# Patient Record
Sex: Male | Born: 1960 | Race: White | Hispanic: No | Marital: Married | Smoking: Former smoker
Health system: Southern US, Community
[De-identification: ages and names within clinical notes are randomized; demographics above are authoritative.]

## PROBLEM LIST (undated history)

## (undated) DIAGNOSIS — I10 Essential (primary) hypertension: Secondary | ICD-10-CM

## (undated) DIAGNOSIS — M1990 Unspecified osteoarthritis, unspecified site: Secondary | ICD-10-CM

## (undated) DIAGNOSIS — K219 Gastro-esophageal reflux disease without esophagitis: Secondary | ICD-10-CM

## (undated) DIAGNOSIS — G709 Myoneural disorder, unspecified: Secondary | ICD-10-CM

## (undated) DIAGNOSIS — C61 Malignant neoplasm of prostate: Secondary | ICD-10-CM

## (undated) HISTORY — DX: Myoneural disorder, unspecified: G70.9

## (undated) HISTORY — PX: PROSTATE BIOPSY: SHX241

## (undated) HISTORY — PX: HERNIA REPAIR: SHX51

## (undated) HISTORY — DX: Malignant neoplasm of prostate: C61

## (undated) HISTORY — DX: Unspecified osteoarthritis, unspecified site: M19.90

## (undated) HISTORY — DX: Gastro-esophageal reflux disease without esophagitis: K21.9

## (undated) HISTORY — PX: CHOLECYSTECTOMY: SHX55

## (undated) HISTORY — DX: Essential (primary) hypertension: I10

## (undated) HISTORY — PX: ANAL FISSURE REPAIR: SHX2312

---

## 2016-01-10 ENCOUNTER — Other Ambulatory Visit: Payer: Self-pay | Admitting: Family Medicine

## 2016-01-10 DIAGNOSIS — R011 Cardiac murmur, unspecified: Secondary | ICD-10-CM

## 2016-01-13 ENCOUNTER — Ambulatory Visit: Payer: Self-pay

## 2016-07-12 ENCOUNTER — Encounter: Payer: Self-pay | Admitting: Family Medicine

## 2016-07-12 ENCOUNTER — Ambulatory Visit (INDEPENDENT_AMBULATORY_CARE_PROVIDER_SITE_OTHER): Payer: BLUE CROSS/BLUE SHIELD | Admitting: Family Medicine

## 2016-07-12 VITALS — BP 166/78 | HR 67 | Temp 98.3°F | Ht 67.5 in | Wt 200.2 lb

## 2016-07-12 DIAGNOSIS — I10 Essential (primary) hypertension: Secondary | ICD-10-CM

## 2016-07-12 DIAGNOSIS — Z125 Encounter for screening for malignant neoplasm of prostate: Secondary | ICD-10-CM | POA: Diagnosis not present

## 2016-07-12 DIAGNOSIS — R202 Paresthesia of skin: Secondary | ICD-10-CM

## 2016-07-12 DIAGNOSIS — Z13220 Encounter for screening for lipoid disorders: Secondary | ICD-10-CM

## 2016-07-12 LAB — LIPID PANEL PICCOLO, WAIVED
CHOL/HDL RATIO PICCOLO,WAIVE: 3.1 mg/dL
Cholesterol Piccolo, Waived: 194 mg/dL (ref <200)
HDL Chol Piccolo, Waived: 62 mg/dL (ref >59)
LDL CHOL CALC PICCOLO WAIVED: 112 mg/dL — AB (ref <100)
Triglycerides Piccolo,Waived: 101 mg/dL (ref <150)
VLDL CHOL CALC PICCOLO,WAIVE: 20 mg/dL (ref <30)

## 2016-07-12 LAB — UA/M W/RFLX CULTURE, ROUTINE
BILIRUBIN UA: NEGATIVE
GLUCOSE, UA: NEGATIVE
KETONES UA: NEGATIVE
LEUKOCYTES UA: NEGATIVE
Nitrite, UA: NEGATIVE
PROTEIN UA: NEGATIVE
RBC UA: NEGATIVE
SPEC GRAV UA: 1.015 (ref 1.005–1.030)
Urobilinogen, Ur: 0.2 mg/dL (ref 0.2–1.0)
pH, UA: 7 (ref 5.0–7.5)

## 2016-07-12 LAB — MICROALBUMIN, URINE WAIVED
Creatinine, Urine Waived: 100 mg/dL (ref 10–300)
MICROALB, UR WAIVED: 10 mg/L (ref 0–19)
Microalb/Creat Ratio: <30 mg/g (ref <30)

## 2016-07-12 LAB — BAYER DCA HB A1C WAIVED: HB A1C: 5.4 % (ref <7.0)

## 2016-07-12 NOTE — Patient Instructions (Addendum)
DASH Eating Plan DASH stands for "Dietary Approaches to Stop Hypertension." The DASH eating plan is a healthy eating plan that has been shown to reduce high blood pressure (hypertension). Additional health benefits may include reducing the risk of type 2 diabetes mellitus, heart disease, and stroke. The DASH eating plan may also help with weight loss. What do I need to know about the DASH eating plan? For the DASH eating plan, you will follow these general guidelines:  Choose foods with less than 150 milligrams of sodium per serving (as listed on the food label).  Use salt-free seasonings or herbs instead of table salt or sea salt.  Check with your health care provider or pharmacist before using salt substitutes.  Eat lower-sodium products. These are often labeled as "low-sodium" or "no salt added."  Eat fresh foods. Avoid eating a lot of canned foods.  Eat more vegetables, fruits, and low-fat dairy products.  Choose whole grains. Look for the word "whole" as the first word in the ingredient list.  Choose fish and skinless chicken or turkey more often than red meat. Limit fish, poultry, and meat to 6 oz (170 g) each day.  Limit sweets, desserts, sugars, and sugary drinks.  Choose heart-healthy fats.  Eat more home-cooked food and less restaurant, buffet, and fast food.  Limit fried foods.  Do not fry foods. Cook foods using methods such as baking, boiling, grilling, and broiling instead.  When eating at a restaurant, ask that your food be prepared with less salt, or no salt if possible. What foods can I eat? Seek help from a dietitian for individual calorie needs. Grains  Whole grain or whole wheat bread. Brown rice. Whole grain or whole wheat pasta. Quinoa, bulgur, and whole grain cereals. Low-sodium cereals. Corn or whole wheat flour tortillas. Whole grain cornbread. Whole grain crackers. Low-sodium crackers. Vegetables  Fresh or frozen vegetables (raw, steamed, roasted, or  grilled). Low-sodium or reduced-sodium tomato and vegetable juices. Low-sodium or reduced-sodium tomato sauce and paste. Low-sodium or reduced-sodium canned vegetables. Fruits  All fresh, canned (in natural juice), or frozen fruits. Meat and Other Protein Products  Ground beef (85% or leaner), grass-fed beef, or beef trimmed of fat. Skinless chicken or turkey. Ground chicken or turkey. Pork trimmed of fat. All fish and seafood. Eggs. Dried beans, peas, or lentils. Unsalted nuts and seeds. Unsalted canned beans. Dairy  Low-fat dairy products, such as skim or 1% milk, 2% or reduced-fat cheeses, low-fat ricotta or cottage cheese, or plain low-fat yogurt. Low-sodium or reduced-sodium cheeses. Fats and Oils  Tub margarines without trans fats. Light or reduced-fat mayonnaise and salad dressings (reduced sodium). Avocado. Safflower, olive, or canola oils. Natural peanut or almond butter. Other  Unsalted popcorn and pretzels. The items listed above may not be a complete list of recommended foods or beverages. Contact your dietitian for more options.  What foods are not recommended? Grains  White bread. White pasta. White rice. Refined cornbread. Bagels and croissants. Crackers that contain trans fat. Vegetables  Creamed or fried vegetables. Vegetables in a cheese sauce. Regular canned vegetables. Regular canned tomato sauce and paste. Regular tomato and vegetable juices. Fruits  Canned fruit in light or heavy syrup. Fruit juice. Meat and Other Protein Products  Fatty cuts of meat. Ribs, chicken wings, bacon, sausage, bologna, salami, chitterlings, fatback, hot dogs, bratwurst, and packaged luncheon meats. Salted nuts and seeds. Canned beans with salt. Dairy  Whole or 2% milk, cream, half-and-half, and cream cheese. Whole-fat or sweetened yogurt. Full-fat cheeses   or blue cheese. Nondairy creamers and whipped toppings. Processed cheese, cheese spreads, or cheese curds. Condiments  Onion and garlic  salt, seasoned salt, table salt, and sea salt. Canned and packaged gravies. Worcestershire sauce. Tartar sauce. Barbecue sauce. Teriyaki sauce. Soy sauce, including reduced sodium. Steak sauce. Fish sauce. Oyster sauce. Cocktail sauce. Horseradish. Ketchup and mustard. Meat flavorings and tenderizers. Bouillon cubes. Hot sauce. Tabasco sauce. Marinades. Taco seasonings. Relishes. Fats and Oils  Butter, stick margarine, lard, shortening, ghee, and bacon fat. Coconut, palm kernel, or palm oils. Regular salad dressings. Other  Pickles and olives. Salted popcorn and pretzels. The items listed above may not be a complete list of foods and beverages to avoid. Contact your dietitian for more information.  Where can I find more information? National Heart, Lung, and Blood Institute: www.nhlbi.nih.gov/health/health-topics/topics/dash/ This information is not intended to replace advice given to you by your health care provider. Make sure you discuss any questions you have with your health care provider. Document Released: 05/11/2011 Document Revised: 10/28/2015 Document Reviewed: 03/26/2013 Elsevier Interactive Patient Education  2017 Elsevier Inc.  

## 2016-07-12 NOTE — Progress Notes (Signed)
BP (!) 166/78 (BP Location: Left Arm, Patient Position: Sitting, Cuff Size: Normal)   Pulse 67   Temp 98.3 F (36.8 C)   Ht 5' 7.5" (1.715 m)   Wt 200 lb 3.2 oz (90.8 kg)   SpO2 95%   BMI 30.89 kg/m    Subjective:    Patient ID: Douglas Murillo, male    DOB: 1961/03/13, 56 y.o.   MRN: [id number]  HPI: Douglas Murillo is a 56 y.o. male here today to establish care  Chief Complaint  Patient presents with  . Establish Care   He notes that he has not seen a doctor in about 20 years.   NUMBNESS Duration: months Onset: fluctuates, started in his 66s and then stopped and came back about a month ago Location: bottom feet Bilateral: no Symmetric: no Decreased sensation: yes  Weakness: no Pain: yes Quality:  burning Severity: moderate  Frequency: Worse at night Trauma: no Recent illness: no Diabetes: no Thyroid disease: no  HIV: no  Alcoholism: no  Spinal cord injury: no Alleviating factors: nothing Aggravating factors: nothing Status: stable Treatments attempted: none  HYPERTENSION- was on medicine in his 74s and then stopped it. Cut the salt out and hasn't needed it in the past Hypertension status: exacerbated  Satisfied with current treatment? no Duration of hypertension: chronic BP monitoring frequency:  not checking BP range: unknown BP medication side effects:  yes Medication compliance: poor compliance Previous BP meds: unknown Aspirin: no Recurrent headaches: no Visual changes: yes Palpitations: no Dyspnea: no Chest pain: no Lower extremity edema: no Dizzy/lightheaded: no   Active Ambulatory Problems    Diagnosis Date Noted  . HTN (hypertension) 07/12/2016   Resolved Ambulatory Problems    Diagnosis Date Noted  . No Resolved Ambulatory Problems   Past Medical History:  Diagnosis Date  . GERD (gastroesophageal reflux disease)   . Hypertension    Past Surgical History:  Procedure Laterality Date  . ANAL FISSURE REPAIR    .  CHOLECYSTECTOMY     Outpatient Encounter Prescriptions as of 07/12/2016  Medication Sig  . esomeprazole (NEXIUM) 20 MG capsule Take 20 mg by mouth daily at 12 noon.  Marland Kitchen oxymetazoline (AFRIN) 0.05 % nasal spray Place 1 spray into both nostrils 2 (two) times daily.   No facility-administered encounter medications on file as of 07/12/2016.    Allergies  Allergen Reactions  . Prednisone Other (See Comments)    Joint Pain   Family History  Problem Relation Age of Onset  . Arthritis Mother   . Cancer Mother     breast  . Cancer Father     lymphoma  . Diabetes Father   . Hearing loss Father   . Heart disease Father   . Hypertension Father   . Stroke Maternal Grandmother   . Arthritis Maternal Grandmother   . Birth defects Maternal Grandfather   . Arthritis Paternal Grandmother   . Heart disease Paternal Grandfather   . Graves' disease Daughter    Social History   Social History  . Marital status: Married    Spouse name: N/A  . Number of children: N/A  . Years of education: N/A   Social History Main Topics  . Smoking status: Former Smoker    Types: Cigarettes    Quit date: 12/20/2015  . Smokeless tobacco: Never Used  . Alcohol use No  . Drug use: Yes    Types: Marijuana  . Sexual activity: Not Asked   Other Topics Concern  .  None   Social History Narrative  . None    Review of Systems  Constitutional: Negative.   HENT: Positive for congestion, postnasal drip and rhinorrhea. Negative for dental problem, drooling, ear discharge, ear pain, facial swelling, hearing loss, mouth sores, nosebleeds, sinus pain, sinus pressure, sneezing, sore throat, tinnitus, trouble swallowing and voice change.   Respiratory: Negative.   Cardiovascular: Negative.   Gastrointestinal: Negative.   Musculoskeletal: Negative.   Neurological: Positive for numbness. Negative for dizziness, tremors, seizures, syncope, facial asymmetry, speech difficulty, weakness, light-headedness and headaches.    Psychiatric/Behavioral: Negative.     Per HPI unless specifically indicated above     Objective:    BP (!) 166/78 (BP Location: Left Arm, Patient Position: Sitting, Cuff Size: Normal)   Pulse 67   Temp 98.3 F (36.8 C)   Ht 5' 7.5" (1.715 m)   Wt 200 lb 3.2 oz (90.8 kg)   SpO2 95%   BMI 30.89 kg/m   Wt Readings from Last 3 Encounters:  07/12/16 200 lb 3.2 oz (90.8 kg)    Physical Exam  Constitutional: He is oriented to person, place, and time. He appears well-developed and well-nourished. No distress.  HENT:  Head: Normocephalic and atraumatic.  Right Ear: Hearing normal.  Left Ear: Hearing normal.  Nose: Nose normal.  Eyes: Conjunctivae and lids are normal. Right eye exhibits no discharge. Left eye exhibits no discharge. No scleral icterus.  Cardiovascular: Normal rate, regular rhythm, normal heart sounds and intact distal pulses.  Exam reveals no gallop and no friction rub.   No murmur heard. Pulmonary/Chest: Effort normal and breath sounds normal. No respiratory distress. He has no wheezes. He has no rales. He exhibits no tenderness.  Musculoskeletal: Normal range of motion.  Neurological: He is alert and oriented to person, place, and time.  Skin: Skin is warm, dry and intact. No rash noted. He is not diaphoretic. No erythema. No pallor.  Psychiatric: He has a normal mood and affect. His speech is normal and behavior is normal. Judgment and thought content normal. Cognition and memory are normal.  Nursing note and vitals reviewed.   No results found for this or any previous visit.    Assessment & Plan:   Problem List Items Addressed This Visit      Cardiovascular and Mediastinum   HTN (hypertension) - Primary    Will work on [employer]. Recheck 1 month with physical.      Relevant Orders   UA/M w/rflx Culture, Routine   Comprehensive metabolic panel   Microalbumin, Urine Waived    Other Visit Diagnoses    Paresthesias       Concern for diabetes with family  history. Will check labs. Await results. A1c 5.4. Will await other labs.   Relevant Orders   Bayer DCA Hb A1c Waived   CBC with Differential/Platelet   TSH   Comprehensive metabolic panel   Screening for cholesterol level       Checking levels today. Await results. Has had coffee with 2 teaspoons of sugar today.   Relevant Orders   Lipid Panel Piccolo, Waived   Screening for prostate cancer       Labs drawn today. Await results.    Relevant Orders   PSA       Follow up plan: Return in about 4 weeks (around 08/09/2016) for Physical/Recheck BP.

## 2016-07-12 NOTE — Assessment & Plan Note (Signed)
Will work on [employer]. Recheck 1 month with physical.

## 2016-07-13 ENCOUNTER — Encounter: Payer: Self-pay | Admitting: Family Medicine

## 2016-07-13 LAB — COMPREHENSIVE METABOLIC PANEL
ALK PHOS: 102 IU/L (ref 39–117)
ALT: 22 IU/L (ref 0–44)
AST: 30 IU/L (ref 0–40)
Albumin/Globulin Ratio: 1.5 (ref 1.2–2.2)
Albumin: 4.3 g/dL (ref 3.5–5.5)
BUN / CREAT RATIO: 9 (ref 9–20)
BUN: 11 mg/dL (ref 6–24)
Bilirubin Total: 1.2 mg/dL (ref 0.0–1.2)
CALCIUM: 10.2 mg/dL (ref 8.7–10.2)
CHLORIDE: 98 mmol/L (ref 96–106)
CO2: 25 mmol/L (ref 18–29)
Creatinine, Ser: 1.28 mg/dL — ABNORMAL HIGH (ref 0.76–1.27)
GFR calc non Af Amer: 63 mL/min/{1.73_m2} (ref >59)
GFR, EST AFRICAN AMERICAN: 73 mL/min/{1.73_m2} (ref >59)
GLOBULIN, TOTAL: 2.9 g/dL (ref 1.5–4.5)
GLUCOSE: 91 mg/dL (ref 65–99)
POTASSIUM: 4.3 mmol/L (ref 3.5–5.2)
SODIUM: 138 mmol/L (ref 134–144)
Total Protein: 7.2 g/dL (ref 6.0–8.5)

## 2016-07-13 LAB — CBC WITH DIFFERENTIAL/PLATELET
BASOS ABS: 0 10*3/uL (ref 0.0–0.2)
Basos: 1 %
EOS (ABSOLUTE): 0.3 10*3/uL (ref 0.0–0.4)
Eos: 3 %
HEMOGLOBIN: 15.5 g/dL (ref 13.0–17.7)
Hematocrit: 44.3 % (ref 37.5–51.0)
IMMATURE GRANS (ABS): 0 10*3/uL (ref 0.0–0.1)
IMMATURE GRANULOCYTES: 1 %
Lymphocytes Absolute: 2.4 10*3/uL (ref 0.7–3.1)
Lymphs: 28 %
MCH: 32 pg (ref 26.6–33.0)
MCHC: 35 g/dL (ref 31.5–35.7)
MCV: 92 fL (ref 79–97)
MONOCYTES: 10 %
Monocytes Absolute: 0.8 10*3/uL (ref 0.1–0.9)
NEUTROS ABS: 5.2 10*3/uL (ref 1.4–7.0)
NEUTROS PCT: 57 %
PLATELETS: 225 10*3/uL (ref 150–379)
RBC: 4.84 x10E6/uL (ref 4.14–5.80)
RDW: 13.7 % (ref 12.3–15.4)
WBC: 8.8 10*3/uL (ref 3.4–10.8)

## 2016-07-13 LAB — TSH: TSH: 1.29 u[IU]/mL (ref 0.450–4.500)

## 2016-07-13 LAB — PSA: PROSTATE SPECIFIC AG, SERUM: 2.5 ng/mL (ref 0.0–4.0)

## 2016-08-01 ENCOUNTER — Ambulatory Visit (INDEPENDENT_AMBULATORY_CARE_PROVIDER_SITE_OTHER): Payer: BLUE CROSS/BLUE SHIELD | Admitting: Family Medicine

## 2016-08-01 ENCOUNTER — Encounter: Payer: Self-pay | Admitting: Family Medicine

## 2016-08-01 VITALS — BP 184/90 | HR 62 | Temp 98.4°F | Resp 17 | Ht 67.5 in | Wt 198.0 lb

## 2016-08-01 DIAGNOSIS — D485 Neoplasm of uncertain behavior of skin: Secondary | ICD-10-CM

## 2016-08-01 DIAGNOSIS — I10 Essential (primary) hypertension: Secondary | ICD-10-CM

## 2016-08-01 DIAGNOSIS — Z0000 Encounter for general adult medical examination without abnormal findings: Secondary | ICD-10-CM | POA: Diagnosis not present

## 2016-08-01 MED ORDER — LISINOPRIL 10 MG PO TABS: 10.0000 mg | ORAL_TABLET | Freq: Every day | ORAL | 3 refills | Status: DC

## 2016-08-01 NOTE — Progress Notes (Signed)
BP (!) 184/90   Pulse 62   Temp 98.4 F (36.9 C) (Oral)   Resp 17   Ht 5' 7.5" (1.715 m)   Wt 198 lb (89.8 kg)   SpO2 96%   BMI 30.55 kg/m    Subjective:    Patient ID: Douglas Murillo, male    DOB: [birth year], 56 y.o.   MRN: [id number]  HPI: Douglas Murillo is a 56 y.o. male presenting on 08/01/2016 for comprehensive medical examination. Current medical complaints include:  HYPERTENSION Hypertension status: uncontrolled  Satisfied with current treatment? no Duration of hypertension: unknown BP monitoring frequency:  not checking Aspirin: no Recurrent headaches: no Visual changes: no Palpitations: no Dyspnea: no Chest pain: no Lower extremity edema: no Dizzy/lightheaded: no  He currently lives with: wife Interim Problems from his last visit: no  Depression Screen done today and results listed below:  Depression screen [facility] 2/9 08/01/2016  Decreased Interest 0  Down, Depressed, Hopeless 0  PHQ - 2 Score 0    Past Medical History:  Past Medical History:  Diagnosis Date  . GERD (gastroesophageal reflux disease)   . Hypertension     Surgical History:  Past Surgical History:  Procedure Laterality Date  . ANAL FISSURE REPAIR    . CHOLECYSTECTOMY      Medications:  Current Outpatient Prescriptions on File Prior to Visit  Medication Sig  . esomeprazole (NEXIUM) 20 MG capsule Take 20 mg by mouth daily at 12 noon.  Marland Kitchen oxymetazoline (AFRIN) 0.05 % nasal spray Place 1 spray into both nostrils 2 (two) times daily.   No current facility-administered medications on file prior to visit.     Allergies:  Allergies  Allergen Reactions  . Prednisone Other (See Comments)    Joint Pain    Social History:  Social History   Social History  . Marital status: Married    Spouse name: N/A  . Number of children: N/A  . Years of education: N/A   Occupational History  . Not on file.   Social History Main Topics  . Smoking status: Former Smoker    Types:  Cigarettes    Quit date: 12/20/2015  . Smokeless tobacco: Never Used  . Alcohol use No  . Drug use: Yes    Types: Marijuana  . Sexual activity: Not on file   Other Topics Concern  . Not on file   Social History Narrative  . No narrative on file   History  Smoking Status  . Former Smoker  . Types: Cigarettes  . Quit date: 12/20/2015  Smokeless Tobacco  . Never Used   History  Alcohol Use No    Family History:  Family History  Problem Relation Age of Onset  . Arthritis Mother   . Cancer Mother     breast  . Cancer Father     lymphoma  . Diabetes Father   . Hearing loss Father   . Heart disease Father   . Hypertension Father   . Stroke Maternal Grandmother   . Arthritis Maternal Grandmother   . Birth defects Maternal Grandfather   . Arthritis Paternal Grandmother   . Heart disease Paternal Grandfather   . Graves' disease Daughter     Past medical history, surgical history, medications, allergies, family history and social history reviewed with patient today and changes made to appropriate areas of the chart.   Review of Systems  Constitutional: Negative.   HENT: Positive for hearing loss. Negative for congestion, ear discharge, ear  pain, nosebleeds, sinus pain, sore throat and tinnitus.   Eyes: Negative.   Respiratory: Negative.  Negative for stridor.   Cardiovascular: Negative.   Gastrointestinal: Negative.   Genitourinary: Negative.        Nocturia x4-5  + Weak stream No incomplete emptying or pushing to get started + dribbling  Musculoskeletal: Negative.   Skin: Negative.   Neurological: Positive for tingling. Negative for dizziness, tremors, sensory change, speech change, focal weakness, seizures, loss of consciousness and headaches.  Endo/Heme/Allergies: Positive for environmental allergies. Negative for polydipsia. Does not bruise/bleed easily.  Psychiatric/Behavioral: Negative for depression, hallucinations, memory loss, substance abuse and suicidal  ideas. The patient is nervous/anxious. The patient does not have insomnia.     All other ROS negative except what is listed above and in the HPI.      Objective:    BP (!) 184/90   Pulse 62   Temp 98.4 F (36.9 C) (Oral)   Resp 17   Ht 5' 7.5" (1.715 m)   Wt 198 lb (89.8 kg)   SpO2 96%   BMI 30.55 kg/m   Wt Readings from Last 3 Encounters:  08/01/16 198 lb (89.8 kg)  07/12/16 200 lb 3.2 oz (90.8 kg)    Physical Exam  Constitutional: He is oriented to person, place, and time. He appears well-developed and well-nourished. No distress.  HENT:  Head: Normocephalic and atraumatic.  Right Ear: Hearing, tympanic membrane, external ear and ear canal normal.  Left Ear: Hearing, tympanic membrane, external ear and ear canal normal.  Nose: Nose normal.  Mouth/Throat: Uvula is midline, oropharynx is clear and moist and mucous membranes are normal. No oropharyngeal exudate.  Eyes: Conjunctivae, EOM and lids are normal. Pupils are equal, round, and reactive to light. Right eye exhibits no discharge. Left eye exhibits no discharge. No scleral icterus.  Neck: Normal range of motion. Neck supple. No JVD present. No tracheal deviation present. No thyromegaly present.  Cardiovascular: Normal rate, regular rhythm, normal heart sounds and intact distal pulses.  Exam reveals no gallop and no friction rub.   No murmur heard. Pulmonary/Chest: Effort normal and breath sounds normal. No stridor. No respiratory distress. He has no wheezes. He has no rales. He exhibits no tenderness.  Abdominal: Soft. Bowel sounds are normal. He exhibits no distension and no mass. There is no tenderness. There is no rebound and no guarding.  Genitourinary:  Genitourinary Comments: Deferred at patient's request  Musculoskeletal: Normal range of motion. He exhibits no edema, tenderness or deformity.  Lymphadenopathy:    He has no cervical adenopathy.  Neurological: He is alert and oriented to person, place, and time. He  has normal reflexes. He displays normal reflexes. No cranial nerve deficit. He exhibits normal muscle tone. Coordination normal.  Skin: Skin is warm, dry and intact. No rash noted. He is not diaphoretic. No erythema. No pallor.  0.5cm hyperpigmented lesion on top of his head, 1.5cm raised hyperpigmented lesion on L side of his neck  Psychiatric: His speech is normal and behavior is normal. Judgment and thought content normal. His mood appears anxious. Cognition and memory are normal.  Nursing note and vitals reviewed.   Results for orders placed or performed in visit on 07/12/16  Lipid Panel Piccolo, [employer]  Result Value Ref Range   Cholesterol Piccolo, Waived 194 <200 mg/dL   HDL Chol Piccolo, Waived 62 >59 mg/dL   Triglycerides Piccolo,Waived 101 <150 mg/dL   Chol/HDL Ratio Piccolo,Waive 3.1 mg/dL   LDL Chol [employer]  Waived 112 (H) <100 mg/dL   VLDL Chol Calc Piccolo,Waive 20 <30 mg/dL  Bayer DCA Hb A1c Waived  Result Value Ref Range   Bayer DCA Hb A1c Waived 5.4 <7.0 %  CBC with Differential/Platelet  Result Value Ref Range   WBC 8.8 3.4 - 10.8 x10E3/uL   RBC 4.84 4.14 - 5.80 x10E6/uL   Hemoglobin 15.5 13.0 - 17.7 g/dL   Hematocrit 44.3 37.5 - 51.0 %   MCV 92 79 - 97 fL   MCH 32.0 26.6 - 33.0 pg   MCHC 35.0 31.5 - 35.7 g/dL   RDW 13.7 12.3 - 15.4 %   Platelets 225 150 - 379 x10E3/uL   Neutrophils 57 Not Estab. %   Lymphs 28 Not Estab. %   Monocytes 10 Not Estab. %   Eos 3 Not Estab. %   Basos 1 Not Estab. %   Neutrophils Absolute 5.2 1.4 - 7.0 x10E3/uL   Lymphocytes Absolute 2.4 0.7 - 3.1 x10E3/uL   Monocytes Absolute 0.8 0.1 - 0.9 x10E3/uL   EOS (ABSOLUTE) 0.3 0.0 - 0.4 x10E3/uL   Basophils Absolute 0.0 0.0 - 0.2 x10E3/uL   Immature Granulocytes 1 Not Estab. %   Immature Grans (Abs) 0.0 0.0 - 0.1 x10E3/uL  TSH  Result Value Ref Range   TSH 1.290 0.450 - 4.500 uIU/mL  PSA  Result Value Ref Range   Prostate Specific Ag, Serum 2.5 0.0 - 4.0 ng/mL  UA/M w/rflx  Culture, Routine  Result Value Ref Range   Specific Gravity, UA 1.015 1.005 - 1.030   pH, UA 7.0 5.0 - 7.5   Color, UA Yellow Yellow   Appearance Ur Clear Clear   Leukocytes, UA Negative Negative   Protein, UA Negative Negative/Trace   Glucose, UA Negative Negative   Ketones, UA Negative Negative   RBC, UA Negative Negative   Bilirubin, UA Negative Negative   Urobilinogen, Ur 0.2 0.2 - 1.0 mg/dL   Nitrite, UA Negative Negative  Comprehensive metabolic panel  Result Value Ref Range   Glucose 91 65 - 99 mg/dL   BUN 11 6 - 24 mg/dL   Creatinine, Ser 1.28 (H) 0.76 - 1.27 mg/dL   GFR calc non Af Amer 63 >59 mL/min/1.73   GFR calc Af Amer 73 >59 mL/min/1.73   BUN/Creatinine Ratio 9 9 - 20   Sodium 138 134 - 144 mmol/L   Potassium 4.3 3.5 - 5.2 mmol/L   Chloride 98 96 - 106 mmol/L   CO2 25 18 - 29 mmol/L   Calcium 10.2 8.7 - 10.2 mg/dL   Total Protein 7.2 6.0 - 8.5 g/dL   Albumin 4.3 3.5 - 5.5 g/dL   Globulin, Total 2.9 1.5 - 4.5 g/dL   Albumin/Globulin Ratio 1.5 1.2 - 2.2   Bilirubin Total 1.2 0.0 - 1.2 mg/dL   Alkaline Phosphatase 102 39 - 117 IU/L   AST 30 0 - 40 IU/L   ALT 22 0 - 44 IU/L  Microalbumin, Urine Waived  Result Value Ref Range   Microalb, Ur Waived 10 0 - 19 mg/L   Creatinine, Urine Waived 100 10 - 300 mg/dL   Microalb/Creat Ratio <30 <30 mg/g      Assessment & Plan:   Problem List Items Addressed This Visit      Cardiovascular and Mediastinum   HTN (hypertension)    Will start 10mg  lisinopril and recheck 2-3 weeks. Will likely need higher dose, but I think there is a white-coat component here, and we don't  want to drop him too quickly      Relevant Medications   lisinopril (PRINIVIL,ZESTRIL) 10 MG tablet    Other Visit Diagnoses    Routine general medical examination at a health care facility    -  Primary   Vaccines discussed as below. Screening labs checked last visit. Colonoscopy done previously- will likely need another, but will discuss next  visit. Work on diet   Neoplasm of uncertain behavior of skin       Will remove by shave biopsy next visit and send for pathology.       Discussed aspirin prophylaxis for myocardial infarction prevention and decision was it was not indicated  LABORATORY TESTING:  Health maintenance labs ordered today as discussed above.   IMMUNIZATIONS:   - Tdap: Tetanus vaccination status reviewed: last tetanus booster within 10 years. - Influenza: Refused - Pneumovax: Refused - Prevnar: Refused - Zostavax vaccine: Refused  SCREENING: - Colonoscopy: Done previously about 56yo at [facility] with Dr. Jamal Collin- will get records  Discussed with patient purpose of the colonoscopy is to detect colon cancer at curable precancerous or early stages   PATIENT COUNSELING:    Sexuality: Discussed sexually transmitted diseases, partner selection, use of condoms, avoidance of unintended pregnancy  and contraceptive alternatives.   Advised to avoid cigarette smoking.  I discussed with the patient that most people either abstain from alcohol or drink within safe limits (<=14/week and <=4 drinks/occasion for males, <=7/weeks and <= 3 drinks/occasion for females) and that the risk for alcohol disorders and other health effects rises proportionally with the number of drinks per week and how often a drinker exceeds daily limits.  Discussed cessation/primary prevention of drug use and availability of treatment for abuse.   Diet: Encouraged to adjust caloric intake to maintain  or achieve ideal body weight, to reduce intake of dietary saturated fat and total fat, to limit sodium intake by avoiding high sodium foods and not adding table salt, and to maintain adequate dietary potassium and calcium preferably from fresh fruits, vegetables, and low-fat dairy products.    stressed the importance of regular exercise  Injury prevention: Discussed safety belts, safety helmets, smoke detector, smoking near bedding or upholstery.    Dental health: Discussed importance of regular tooth brushing, flossing, and dental visits.   Follow up plan: NEXT PREVENTATIVE PHYSICAL DUE IN 1 YEAR. Return 2-3 weeks, for Shave biopsy and BP check.

## 2016-08-01 NOTE — Patient Instructions (Addendum)
 Health Maintenance, Male A healthy lifestyle and preventive care is important for your health and wellness. Ask your health care provider about what schedule of regular examinations is right for you. What should I know about weight and diet?  Eat a Healthy Diet  Eat plenty of vegetables, fruits, whole grains, low-fat dairy products, and lean protein.  Do not eat a lot of foods high in solid fats, added sugars, or salt. Maintain a Healthy Weight  Regular exercise can help you achieve or maintain a healthy weight. You should:  Do at least 150 minutes of exercise each week. The exercise should increase your heart rate and make you sweat (moderate-intensity exercise).  Do strength-training exercises at least twice a week. Watch Your Levels of Cholesterol and Blood Lipids  Have your blood tested for lipids and cholesterol every 5 years starting at 55 years of age. If you are at high risk for heart disease, you should start having your blood tested when you are 55 years old. You may need to have your cholesterol levels checked more often if:  Your lipid or cholesterol levels are high.  You are older than 55 years of age.  You are at high risk for heart disease. What should I know about cancer screening? Many types of cancers can be detected early and may often be prevented. Lung Cancer  You should be screened every year for lung cancer if:  You are a current smoker who has smoked for at least 30 years.  You are a former smoker who has quit within the past 15 years.  Talk to your health care provider about your screening options, when you should start screening, and how often you should be screened. Colorectal Cancer  Routine colorectal cancer screening usually begins at 55 years of age and should be repeated every 5-10 years until you are 55 years old. You may need to be screened more often if early forms of precancerous polyps or small growths are found. Your health care provider  may recommend screening at an earlier age if you have risk factors for colon cancer.  Your health care provider may recommend using home test kits to check for hidden blood in the stool.  A small camera at the end of a tube can be used to examine your colon (sigmoidoscopy or colonoscopy). This checks for the earliest forms of colorectal cancer. Prostate and Testicular Cancer  Depending on your age and overall health, your health care provider may do certain tests to screen for prostate and testicular cancer.  Talk to your health care provider about any symptoms or concerns you have about testicular or prostate cancer. Skin Cancer  Check your skin from head to toe regularly.  Tell your health care provider about any new moles or changes in moles, especially if:  There is a change in a mole's size, shape, or color.  You have a mole that is larger than a pencil eraser.  Always use sunscreen. Apply sunscreen liberally and repeat throughout the day.  Protect yourself by wearing long sleeves, pants, a wide-brimmed hat, and sunglasses when outside. What should I know about heart disease, diabetes, and high blood pressure?  If you are 18-39 years of age, have your blood pressure checked every 3-5 years. If you are 40 years of age or older, have your blood pressure checked every year. You should have your blood pressure measured twice-once when you are at a hospital or clinic, and once when you are not at   a hospital or clinic. Record the average of the two measurements. To check your blood pressure when you are not at a hospital or clinic, you can use:  An automated blood pressure machine at a pharmacy.  A home blood pressure monitor.  Talk to your health care provider about your target blood pressure.  If you are between 45-79 years old, ask your health care provider if you should take aspirin to prevent heart disease.  Have regular diabetes screenings by checking your fasting blood sugar  level.  If you are at a normal weight and have a low risk for diabetes, have this test once every three years after the age of 45.  If you are overweight and have a high risk for diabetes, consider being tested at a younger age or more often.  A one-time screening for abdominal aortic aneurysm (AAA) by ultrasound is recommended for men aged 65-75 years who are current or former smokers. What should I know about preventing infection? Hepatitis B  If you have a higher risk for hepatitis B, you should be screened for this virus. Talk with your health care provider to find out if you are at risk for hepatitis B infection. Hepatitis C  Blood testing is recommended for:  Everyone born from 1945 through 1965.  Anyone with known risk factors for hepatitis C. Sexually Transmitted Diseases (STDs)  You should be screened each year for STDs including gonorrhea and chlamydia if:  You are sexually active and are younger than 55 years of age.  You are older than 55 years of age and your health care provider tells you that you are at risk for this type of infection.  Your sexual activity has changed since you were last screened and you are at an increased risk for chlamydia or gonorrhea. Ask your health care provider if you are at risk.  Talk with your health care provider about whether you are at high risk of being infected with HIV. Your health care provider may recommend a prescription medicine to help prevent HIV infection. What else can I do?  Schedule regular health, dental, and eye exams.  Stay current with your vaccines (immunizations).  Do not use any tobacco products, such as cigarettes, chewing tobacco, and e-cigarettes. If you need help quitting, ask your health care provider.  Limit alcohol intake to no more than 2 drinks per day. One drink equals 12 ounces of beer, 5 ounces of wine, or 1 ounces of hard liquor.  Do not use street drugs.  Do not share needles.  Ask your health  care provider for help if you need support or information about quitting drugs.  Tell your health care provider if you often feel depressed.  Tell your health care provider if you have ever been abused or do not feel safe at home. This information is not intended to replace advice given to you by your health care provider. Make sure you discuss any questions you have with your health care provider. Document Released: 11/18/2007 Document Revised: 01/19/2016 Document Reviewed: 02/23/2015 Elsevier Interactive Patient Education  2017 [employer]  

## 2016-08-01 NOTE — Assessment & Plan Note (Signed)
Will start 10mg  lisinopril and recheck 2-3 weeks. Will likely need higher dose, but I think there is a white-coat component here, and we don't want to drop him too quickly

## 2016-08-08 ENCOUNTER — Telehealth: Payer: Self-pay | Admitting: Family Medicine

## 2016-08-08 NOTE — Telephone Encounter (Signed)
Faxed request for records from Dr Jamal Collin on 08/08/2016

## 2016-08-29 ENCOUNTER — Ambulatory Visit (INDEPENDENT_AMBULATORY_CARE_PROVIDER_SITE_OTHER): Payer: BLUE CROSS/BLUE SHIELD | Admitting: Family Medicine

## 2016-08-29 ENCOUNTER — Encounter: Payer: Self-pay | Admitting: Family Medicine

## 2016-08-29 VITALS — BP 136/84 | HR 70 | Temp 98.1°F | Resp 17 | Ht 67.5 in | Wt 199.0 lb

## 2016-08-29 DIAGNOSIS — D485 Neoplasm of uncertain behavior of skin: Secondary | ICD-10-CM

## 2016-08-29 DIAGNOSIS — I10 Essential (primary) hypertension: Secondary | ICD-10-CM

## 2016-08-29 MED ORDER — LISINOPRIL 10 MG PO TABS: 10.0000 mg | ORAL_TABLET | Freq: Every day | ORAL | 1 refills | Status: DC

## 2016-08-29 NOTE — Assessment & Plan Note (Signed)
Under good control. Continue current regimen. Continue to monitor. Call with any concerns. 

## 2016-08-29 NOTE — Progress Notes (Signed)
BP 136/84 (BP Location: Right Arm, Patient Position: Sitting, Cuff Size: Normal)   Pulse 70   Temp 98.1 F (36.7 C) (Oral)   Resp 17   Ht 5' 7.5" (1.715 m)   Wt 199 lb (90.3 kg)   SpO2 95%   BMI 30.71 kg/m    Subjective:    Patient ID: Douglas Murillo, male    DOB: [birth year], 56 y.o.   MRN: [id number]  HPI: Douglas Murillo is a 56 y.o. male  Chief Complaint  Patient presents with  . Hypertension   SKIN LESION Duration: Chronic Location:  Top of his head and L side of his neck  Painful: no Itching: no Onset: gradual Context: unsure Associated signs and symptoms: none History of skin cancer: no History of precancerous skin lesions: no Family history of skin cancer: no   HYPERTENSION Hypertension status: better  Satisfied with current treatment? yes Duration of hypertension: months BP monitoring frequency:  not checking BP medication side effects:  no Medication compliance: excellent compliance Previous BP meds: lisinopril Aspirin: no Recurrent headaches: yes Visual changes: no Palpitations: no Dyspnea: no Chest pain: no Lower extremity edema: no Dizzy/lightheaded: no  Relevant past medical, surgical, family and social history reviewed and updated as indicated. Interim medical history since our last visit reviewed. Allergies and medications reviewed and updated.  Review of Systems  Constitutional: Negative.   Respiratory: Negative.   Cardiovascular: Negative.   Skin: Negative.  Negative for color change, pallor, rash and wound.  Psychiatric/Behavioral: Negative.     Per HPI unless specifically indicated above     Objective:    BP 136/84 (BP Location: Right Arm, Patient Position: Sitting, Cuff Size: Normal)   Pulse 70   Temp 98.1 F (36.7 C) (Oral)   Resp 17   Ht 5' 7.5" (1.715 m)   Wt 199 lb (90.3 kg)   SpO2 95%   BMI 30.71 kg/m   Wt Readings from Last 3 Encounters:  08/29/16 199 lb (90.3 kg)  08/01/16 198 lb (89.8 kg)    07/12/16 200 lb 3.2 oz (90.8 kg)    Physical Exam  Constitutional: He is oriented to person, place, and time. He appears well-developed and well-nourished. No distress.  HENT:  Head: Normocephalic and atraumatic.  Right Ear: Hearing normal.  Left Ear: Hearing normal.  Nose: Nose normal.  Eyes: Conjunctivae and lids are normal. Right eye exhibits no discharge. Left eye exhibits no discharge. No scleral icterus.  Cardiovascular: Normal rate, regular rhythm, normal heart sounds and intact distal pulses.  Exam reveals no gallop and no friction rub.   No murmur heard. Pulmonary/Chest: Effort normal and breath sounds normal. No respiratory distress. He has no wheezes. He has no rales. He exhibits no tenderness.  Musculoskeletal: Normal range of motion.  Neurological: He is alert and oriented to person, place, and time.  Skin: Skin is warm, dry and intact. No rash noted. No erythema. No pallor.  0.5cm hyperpigmented lesion on top of his head, 1.5cm raised hyperpigmented lesion on L side of his neck   Psychiatric: He has a normal mood and affect. His speech is normal and behavior is normal. Judgment and thought content normal. Cognition and memory are normal.  Nursing note and vitals reviewed.   Results for orders placed or performed in visit on 07/12/16  Lipid Panel Piccolo, [employer]  Result Value Ref Range   Cholesterol Piccolo, Waived 194 <200 mg/dL   HDL Chol Piccolo, Waived 62 >59 mg/dL   Triglycerides  Piccolo,Waived 101 <150 mg/dL   Chol/HDL Ratio Piccolo,Waive 3.1 mg/dL   LDL Chol Calc Piccolo Waived 112 (H) <100 mg/dL   VLDL Chol Calc Piccolo,Waive 20 <30 mg/dL  Bayer DCA Hb A1c Waived  Result Value Ref Range   Bayer DCA Hb A1c Waived 5.4 <7.0 %  CBC with Differential/Platelet  Result Value Ref Range   WBC 8.8 3.4 - 10.8 x10E3/uL   RBC 4.84 4.14 - 5.80 x10E6/uL   Hemoglobin 15.5 13.0 - 17.7 g/dL   Hematocrit 44.3 37.5 - 51.0 %   MCV 92 79 - 97 fL   MCH 32.0 26.6 - 33.0 pg    MCHC 35.0 31.5 - 35.7 g/dL   RDW 13.7 12.3 - 15.4 %   Platelets 225 150 - 379 x10E3/uL   Neutrophils 57 Not Estab. %   Lymphs 28 Not Estab. %   Monocytes 10 Not Estab. %   Eos 3 Not Estab. %   Basos 1 Not Estab. %   Neutrophils Absolute 5.2 1.4 - 7.0 x10E3/uL   Lymphocytes Absolute 2.4 0.7 - 3.1 x10E3/uL   Monocytes Absolute 0.8 0.1 - 0.9 x10E3/uL   EOS (ABSOLUTE) 0.3 0.0 - 0.4 x10E3/uL   Basophils Absolute 0.0 0.0 - 0.2 x10E3/uL   Immature Granulocytes 1 Not Estab. %   Immature Grans (Abs) 0.0 0.0 - 0.1 x10E3/uL  TSH  Result Value Ref Range   TSH 1.290 0.450 - 4.500 uIU/mL  PSA  Result Value Ref Range   Prostate Specific Ag, Serum 2.5 0.0 - 4.0 ng/mL  UA/M w/rflx Culture, Routine  Result Value Ref Range   Specific Gravity, UA 1.015 1.005 - 1.030   pH, UA 7.0 5.0 - 7.5   Color, UA Yellow Yellow   Appearance Ur Clear Clear   Leukocytes, UA Negative Negative   Protein, UA Negative Negative/Trace   Glucose, UA Negative Negative   Ketones, UA Negative Negative   RBC, UA Negative Negative   Bilirubin, UA Negative Negative   Urobilinogen, Ur 0.2 0.2 - 1.0 mg/dL   Nitrite, UA Negative Negative  Comprehensive metabolic panel  Result Value Ref Range   Glucose 91 65 - 99 mg/dL   BUN 11 6 - 24 mg/dL   Creatinine, Ser 1.28 (H) 0.76 - 1.27 mg/dL   GFR calc non Af Amer 63 >59 mL/min/1.73   GFR calc Af Amer 73 >59 mL/min/1.73   BUN/Creatinine Ratio 9 9 - 20   Sodium 138 134 - 144 mmol/L   Potassium 4.3 3.5 - 5.2 mmol/L   Chloride 98 96 - 106 mmol/L   CO2 25 18 - 29 mmol/L   Calcium 10.2 8.7 - 10.2 mg/dL   Total Protein 7.2 6.0 - 8.5 g/dL   Albumin 4.3 3.5 - 5.5 g/dL   Globulin, Total 2.9 1.5 - 4.5 g/dL   Albumin/Globulin Ratio 1.5 1.2 - 2.2   Bilirubin Total 1.2 0.0 - 1.2 mg/dL   Alkaline Phosphatase 102 39 - 117 IU/L   AST 30 0 - 40 IU/L   ALT 22 0 - 44 IU/L  Microalbumin, Urine Waived  Result Value Ref Range   Microalb, Ur Waived 10 0 - 19 mg/L   Creatinine, Urine Waived  100 10 - 300 mg/dL   Microalb/Creat Ratio <30 <30 mg/g      Assessment & Plan:   Problem List Items Addressed This Visit      Cardiovascular and Mediastinum   HTN (hypertension) - Primary    Under good control. Continue current  regimen. Continue to monitor. Call with any concerns.       Relevant Medications   lisinopril (PRINIVIL,ZESTRIL) 10 MG tablet   Other Relevant Orders   Basic metabolic panel    Other Visit Diagnoses    Neoplasm of uncertain behavior of skin       Shave biopsies done today. Await results.    Relevant Orders   Pathology Report      Skin Procedure  Procedure: Informed consent given.  Sterile prep of the area.  Area infiltrated with lidocaine with epinephrine.  Using a surgical blade, part of the upper dermis shaved off and sent  for pathology.  Area cauterized. Pt ed on scarring.     Diagnosis:   ICD-9-CM ICD-10-CM   1. Essential hypertension 401.9 C94 Basic metabolic panel  2. 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 Await results.     Lesion Location/Size: 0.5cm hyperpigmented lesion on top of his head, 1.5cm raised hyperpigmented lesion on L side of his neck  Physician: MJ Consent:  Risks, benefits, and alternative treatments discussed and all questions were answered.  Patient elected to proceed and verbal consent obtained.  Description: Area prepped and draped using semi-sterile technique. Areas locally anesthetized using 4 cc's of lidocaine 2% with epi. Shave biopsy of lesion performed using a dermablade.  Adequate hemostastis achieved using Silver Nitrate. Wound dressed after application of bacitracin ointment.  Post Procedure Instructions:  Wound care instructions discussed and patient was instructed to keep area clean and dry.  Signs and symptoms of infection discussed, patient agrees to contact the office ASAP should they occur.  Dressing change recommended every other day.   Follow up  plan: Return in about 6 months (around 03/01/2017) for BP follow up.

## 2016-08-30 ENCOUNTER — Encounter: Payer: Self-pay | Admitting: Family Medicine

## 2016-08-30 LAB — BASIC METABOLIC PANEL
BUN/Creatinine Ratio: 15 (ref 9–20)
BUN: 20 mg/dL (ref 6–24)
CALCIUM: 9.4 mg/dL (ref 8.7–10.2)
CO2: 28 mmol/L (ref 18–29)
Chloride: 99 mmol/L (ref 96–106)
Creatinine, Ser: 1.34 mg/dL — ABNORMAL HIGH (ref 0.76–1.27)
GFR, EST AFRICAN AMERICAN: 68 mL/min/{1.73_m2} (ref >59)
GFR, EST NON AFRICAN AMERICAN: 59 mL/min/{1.73_m2} — AB (ref >59)
Glucose: 83 mg/dL (ref 65–99)
POTASSIUM: 4.7 mmol/L (ref 3.5–5.2)
Sodium: 140 mmol/L (ref 134–144)

## 2016-08-31 LAB — PATHOLOGY

## 2016-10-09 ENCOUNTER — Telehealth: Payer: Self-pay | Admitting: Family Medicine

## 2016-10-09 NOTE — Telephone Encounter (Signed)
If not doing well, will need an appointment.

## 2016-10-09 NOTE — Telephone Encounter (Signed)
Patient's wife called in regards to patient not interacting well to his new medication of lisinopril 10 MG. Patient's wife stated that patient has been lethargic, coughing and reacting negatively to the medication. Patient's wife called to inform provider of the medication reactions. Please Advise.   Wife's Arcenio Mullaly) contact number: [phone]  Thank you.

## 2016-10-09 NOTE — Telephone Encounter (Signed)
Tried to call patient's wife, no answer, left a message for her to call and get patient scheduled for an appointment.

## 2016-10-10 NOTE — Telephone Encounter (Signed)
Called patient, no answer, left message for patient to call and schedule an appointment.

## 2016-10-12 NOTE — Telephone Encounter (Signed)
Tried to call patient several times without any return calls, left voicemail's each time. Will see if they call back.

## 2017-03-02 ENCOUNTER — Other Ambulatory Visit: Payer: Self-pay | Admitting: Family Medicine

## 2017-03-02 ENCOUNTER — Telehealth: Payer: Self-pay | Admitting: Family Medicine

## 2017-03-02 MED ORDER — LISINOPRIL 10 MG PO TABS: 10.0000 mg | ORAL_TABLET | Freq: Every day | ORAL | 2 refills | Status: DC

## 2017-03-02 NOTE — Telephone Encounter (Signed)
Patient is completely out of his the following medication and would like Dr Wynetta Emery to send in a refill for him  Lisionopril 10mg   Thank you  [phone] Douglas Murillo

## 2017-03-02 NOTE — Telephone Encounter (Signed)
Rx keyed up to send- just need to find out what pharmacy he uses.

## 2017-03-02 NOTE — Telephone Encounter (Signed)
Called and left a message asking patient to let us know what pharmacy he would like his medication sent to.

## 2017-03-02 NOTE — Telephone Encounter (Signed)
Routing to provider  

## 2017-03-02 NOTE — Telephone Encounter (Signed)
Patient scheduled appointment for 11/05 stating there was no way he could come in any sooner due to his work.  He also wanted me to let her know that he would have been out of his BP med before his appointment next week as he was short 4 pills  Thank you

## 2017-03-02 NOTE — Telephone Encounter (Signed)
[facility] --[address]

## 2017-03-02 NOTE — Telephone Encounter (Signed)
Patient is due for a 6 month follow up, please get patient scheduled and let Dr.Johnson know that it has been scheduled and she will send enough medication to the patients pharmacy.

## 2017-03-05 ENCOUNTER — Ambulatory Visit: Payer: BLUE CROSS/BLUE SHIELD | Admitting: Family Medicine

## 2017-04-09 ENCOUNTER — Encounter: Payer: Self-pay | Admitting: Family Medicine

## 2017-04-09 ENCOUNTER — Ambulatory Visit: Payer: BLUE CROSS/BLUE SHIELD | Admitting: Family Medicine

## 2017-04-09 VITALS — BP 136/72 | HR 55 | Temp 98.2°F | Wt 205.0 lb

## 2017-04-09 DIAGNOSIS — I10 Essential (primary) hypertension: Secondary | ICD-10-CM | POA: Diagnosis not present

## 2017-04-09 MED ORDER — LISINOPRIL 10 MG PO TABS: 10.0000 mg | ORAL_TABLET | Freq: Every day | ORAL | 1 refills | Status: DC

## 2017-04-09 NOTE — Progress Notes (Signed)
BP 136/72 (BP Location: Left Arm, Cuff Size: Normal)   Pulse (!) 55   Temp 98.2 F (36.8 C)   Wt 205 lb (93 kg)   SpO2 97%   BMI 31.63 kg/m    Subjective:    Patient ID: Douglas Murillo, male    DOB: [birth year], 56 y.o.   MRN: [id number]  HPI: Douglas Murillo is a 56 y.o. male  Chief Complaint  Patient presents with  . Hypertension   HYPERTENSION Hypertension status: better  Satisfied with current treatment? no Duration of hypertension: years BP monitoring frequency:  not checking BP range:  BP medication side effects:  no Medication compliance: excellent compliance Previous BP meds: lisinopril Aspirin: no Recurrent headaches: no Visual changes: no Palpitations: no Dyspnea: no Chest pain: no Lower extremity edema: no Dizzy/lightheaded: no  Relevant past medical, surgical, family and social history reviewed and updated as indicated. Interim medical history since our last visit reviewed. Allergies and medications reviewed and updated.  Review of Systems  Constitutional: Negative.   Respiratory: Negative.   Cardiovascular: Negative.   Psychiatric/Behavioral: Negative.     Per HPI unless specifically indicated above     Objective:    BP 136/72 (BP Location: Left Arm, Cuff Size: Normal)   Pulse (!) 55   Temp 98.2 F (36.8 C)   Wt 205 lb (93 kg)   SpO2 97%   BMI 31.63 kg/m   Wt Readings from Last 3 Encounters:  04/09/17 205 lb (93 kg)  08/29/16 199 lb (90.3 kg)  08/01/16 198 lb (89.8 kg)    Physical Exam  Constitutional: He is oriented to person, place, and time. He appears well-developed and well-nourished. No distress.  HENT:  Head: Normocephalic and atraumatic.  Right Ear: Hearing normal.  Left Ear: Hearing normal.  Nose: Nose normal.  Eyes: Conjunctivae and lids are normal. Right eye exhibits no discharge. Left eye exhibits no discharge. No scleral icterus.  Cardiovascular: Normal rate, regular rhythm, normal heart sounds and intact  distal pulses. Exam reveals no gallop and no friction rub.  No murmur heard. Pulmonary/Chest: Effort normal and breath sounds normal. No respiratory distress. He has no wheezes. He has no rales. He exhibits no tenderness.  Musculoskeletal: Normal range of motion.  Neurological: He is alert and oriented to person, place, and time.  Skin: Skin is warm, dry and intact. No rash noted. He is not diaphoretic. No erythema. No pallor.  Psychiatric: He has a normal mood and affect. His speech is normal and behavior is normal. Judgment and thought content normal. Cognition and memory are normal.  Nursing note and vitals reviewed.   Results for orders placed or performed in visit on 38/18/29  Basic metabolic panel  Result Value Ref Range   Glucose 83 65 - 99 mg/dL   BUN 20 6 - 24 mg/dL   Creatinine, Ser 1.34 (H) 0.76 - 1.27 mg/dL   GFR calc non Af Amer 59 (L) >59 mL/min/1.73   GFR calc Af Amer 68 >59 mL/min/1.73   BUN/Creatinine Ratio 15 9 - 20   Sodium 140 134 - 144 mmol/L   Potassium 4.7 3.5 - 5.2 mmol/L   Chloride 99 96 - 106 mmol/L   CO2 28 18 - 29 mmol/L   Calcium 9.4 8.7 - 10.2 mg/dL  Pathology Report  Result Value Ref Range   . Comment    . Comment    . Comment    . Comment    . Comment    .  Comment    . Comment    . Comment       Assessment & Plan:   Problem List Items Addressed This Visit      Cardiovascular and Mediastinum   HTN (hypertension) - Primary    Under good control on recheck. Continue current regimen. Continue to monitor. Call with any concerns. Recheck 6 months.       Relevant Medications   lisinopril (PRINIVIL,ZESTRIL) 10 MG tablet   Other Relevant Orders   Basic metabolic panel       Follow up plan: Return in about 6 months (around 10/07/2017) for Physical.

## 2017-04-09 NOTE — Assessment & Plan Note (Signed)
Under good control on recheck. Continue current regimen. Continue to monitor. Call with any concerns. Recheck 6 months.

## 2017-04-10 ENCOUNTER — Encounter: Payer: Self-pay | Admitting: Family Medicine

## 2017-04-10 LAB — BASIC METABOLIC PANEL
BUN / CREAT RATIO: 11 (ref 9–20)
BUN: 15 mg/dL (ref 6–24)
CALCIUM: 9.6 mg/dL (ref 8.7–10.2)
CHLORIDE: 104 mmol/L (ref 96–106)
CO2: 25 mmol/L (ref 20–29)
CREATININE: 1.32 mg/dL — AB (ref 0.76–1.27)
GFR calc Af Amer: 70 mL/min/{1.73_m2} (ref >59)
GFR calc non Af Amer: 60 mL/min/{1.73_m2} (ref >59)
GLUCOSE: 81 mg/dL (ref 65–99)
Potassium: 5 mmol/L (ref 3.5–5.2)
Sodium: 143 mmol/L (ref 134–144)

## 2017-10-09 ENCOUNTER — Encounter: Payer: BLUE CROSS/BLUE SHIELD | Admitting: Family Medicine

## 2017-10-29 NOTE — Progress Notes (Signed)
BP 134/64 (BP Location: Left Arm, Cuff Size: Normal)   Pulse (!) 59   Temp 98.3 F (36.8 C)   Ht 5' 6.4" (1.687 m)   Wt 196 lb 4 oz (89 kg)   SpO2 96%   BMI 31.30 kg/m    Subjective:    Patient ID: Douglas Murillo, male    DOB: [birth year], 57 y.o.   MRN: [id number]  HPI: Douglas Murillo is a 57 y.o. male presenting on 10/30/2017 for comprehensive medical examination. Current medical complaints include:  HYPERTENSION Hypertension status: stable  Satisfied with current treatment? yes Duration of hypertension: chronic BP monitoring frequency:  not checking BP medication side effects:  no Medication compliance: excellent compliance Previous BP meds: lisinopril Aspirin: no Recurrent headaches: no Visual changes: no Palpitations: no Dyspnea: no Chest pain: no Lower extremity edema: no Dizzy/lightheaded: no  He currently lives with: wife Interim Problems from his last visit: no  Depression Screen done today and results listed below:  Depression screen [facility] 2/9 10/30/2017 08/01/2016  Decreased Interest 0 0  Down, Depressed, Hopeless 0 0  PHQ - 2 Score 0 0  Altered sleeping 1 -  Tired, decreased energy 1 -  Change in appetite 0 -  Feeling bad or failure about yourself  0 -  Trouble concentrating 0 -  Moving slowly or fidgety/restless 0 -  Suicidal thoughts 0 -  PHQ-9 Score 2 -  Difficult doing work/chores Not difficult at all -    Past Medical History:  Past Medical History:  Diagnosis Date  . GERD (gastroesophageal reflux disease)   . Hypertension     Surgical History:  Past Surgical History:  Procedure Laterality Date  . ANAL FISSURE REPAIR    . CHOLECYSTECTOMY      Medications:  Current Outpatient Medications on File Prior to Visit  Medication Sig  . esomeprazole (NEXIUM) 20 MG capsule Take 20 mg by mouth daily at 12 noon.   No current facility-administered medications on file prior to visit.     Allergies:  Allergies  Allergen Reactions    . Prednisone Other (See Comments)    Joint Pain    Social History:  Social History   Socioeconomic History  . Marital status: Married    Spouse name: Not on file  . Number of children: Not on file  . Years of education: Not on file  . Highest education level: Not on file  Occupational History  . Not on file  Social Needs  . Financial resource strain: Not on file  . Food insecurity:    Worry: Not on file    Inability: Not on file  . Transportation needs:    Medical: Not on file    Non-medical: Not on file  Tobacco Use  . Smoking status: Former Smoker    Types: Cigarettes    Last attempt to quit: 12/20/2015    Years since quitting: 1.8  . Smokeless tobacco: Never Used  Substance and Sexual Activity  . Alcohol use: No  . Drug use: Yes    Types: Marijuana  . Sexual activity: Yes  Lifestyle  . Physical activity:    Days per week: Not on file    Minutes per session: Not on file  . Stress: Not on file  Relationships  . Social connections:    Talks on phone: Not on file    Gets together: Not on file    Attends religious service: Not on file    Active member of  club or organization: Not on file    Attends meetings of clubs or organizations: Not on file    Relationship status: Not on file  . Intimate partner violence:    Fear of current or ex partner: Not on file    Emotionally abused: Not on file    Physically abused: Not on file    Forced sexual activity: Not on file  Other Topics Concern  . Not on file  Social History Narrative  . Not on file   Social History   Tobacco Use  Smoking Status Former Smoker  . Types: Cigarettes  . Last attempt to quit: 12/20/2015  . Years since quitting: 1.8  Smokeless Tobacco Never Used   Social History   Substance and Sexual Activity  Alcohol Use No    Family History:  Family History  Problem Relation Age of Onset  . Arthritis Mother   . Cancer Mother        breast  . Cancer Father        lymphoma  . Diabetes  Father   . Hearing loss Father   . Heart disease Father   . Hypertension Father   . Stroke Maternal Grandmother   . Arthritis Maternal Grandmother   . Birth defects Maternal Grandfather   . Arthritis Paternal Grandmother   . Heart disease Paternal Grandfather   . Graves' disease Daughter     Past medical history, surgical history, medications, allergies, family history and social history reviewed with patient today and changes made to appropriate areas of the chart.   Review of Systems  Constitutional: Negative.   HENT: Negative.   Eyes: Negative.   Respiratory: Negative.   Cardiovascular: Negative.   Gastrointestinal: Negative.   Genitourinary: Negative.   Musculoskeletal: Negative.   Skin: Negative.   Neurological: Negative.   Endo/Heme/Allergies: Positive for environmental allergies. Negative for polydipsia. Does not bruise/bleed easily.  Psychiatric/Behavioral: Negative.     All other ROS negative except what is listed above and in the HPI.      Objective:    BP 134/64 (BP Location: Left Arm, Cuff Size: Normal)   Pulse (!) 59   Temp 98.3 F (36.8 C)   Ht 5' 6.4" (1.687 m)   Wt 196 lb 4 oz (89 kg)   SpO2 96%   BMI 31.30 kg/m   Wt Readings from Last 3 Encounters:  10/30/17 196 lb 4 oz (89 kg)  04/09/17 205 lb (93 kg)  08/29/16 199 lb (90.3 kg)    Physical Exam  Constitutional: He is oriented to person, place, and time. He appears well-developed and well-nourished. No distress.  HENT:  Head: Normocephalic and atraumatic.  Right Ear: Hearing, tympanic membrane, external ear and ear canal normal.  Left Ear: Hearing, tympanic membrane, external ear and ear canal normal.  Nose: Nose normal.  Mouth/Throat: Uvula is midline, oropharynx is clear and moist and mucous membranes are normal. No oropharyngeal exudate.  Eyes: Pupils are equal, round, and reactive to light. Conjunctivae, EOM and lids are normal. Right eye exhibits no discharge. Left eye exhibits no  discharge. No scleral icterus.  Neck: Normal range of motion. Neck supple. No JVD present. No tracheal deviation present. No thyromegaly present.  Cardiovascular: Normal rate, regular rhythm, normal heart sounds and intact distal pulses. Exam reveals no gallop and no friction rub.  No murmur heard. Pulmonary/Chest: Effort normal and breath sounds normal. No stridor. No respiratory distress. He has no wheezes. He has no rales. He exhibits no tenderness.  Abdominal: Soft. Bowel sounds are normal. He exhibits no distension and no mass. There is no tenderness. There is no rebound and no guarding. No hernia. Hernia confirmed negative in the right inguinal area and confirmed negative in the left inguinal area.  Genitourinary: Testes normal and penis normal. Cremasteric reflex is present. Right testis shows no mass, no swelling and no tenderness. Right testis is descended. Cremasteric reflex is not absent on the right side. Left testis shows no mass, no swelling and no tenderness. Left testis is descended. Cremasteric reflex is not absent on the left side. Circumcised. No penile tenderness.  Genitourinary Comments: Prostate exam refused by patient.   Musculoskeletal: Normal range of motion. He exhibits no edema, tenderness or deformity.  Lymphadenopathy:    He has no cervical adenopathy.  Neurological: He is alert and oriented to person, place, and time. He displays normal reflexes. No cranial nerve deficit or sensory deficit. He exhibits normal muscle tone. Coordination normal.  Skin: Skin is warm, dry and intact. Capillary refill takes less than 2 seconds. No rash noted. He is not diaphoretic. No erythema. No pallor.  Psychiatric: He has a normal mood and affect. His speech is normal and behavior is normal. Judgment and thought content normal. Cognition and memory are normal.  Nursing note and vitals reviewed.   Results for orders placed or performed in visit on 23/76/28  Basic metabolic panel  Result  Value Ref Range   Glucose 81 65 - 99 mg/dL   BUN 15 6 - 24 mg/dL   Creatinine, Ser 1.32 (H) 0.76 - 1.27 mg/dL   GFR calc non Af Amer 60 >59 mL/min/1.73   GFR calc Af Amer 70 >59 mL/min/1.73   BUN/Creatinine Ratio 11 9 - 20   Sodium 143 134 - 144 mmol/L   Potassium 5.0 3.5 - 5.2 mmol/L   Chloride 104 96 - 106 mmol/L   CO2 25 20 - 29 mmol/L   Calcium 9.6 8.7 - 10.2 mg/dL      Assessment & Plan:   Problem List Items Addressed This Visit      Cardiovascular and Mediastinum   HTN (hypertension)    Under good control on recheck. Continue current regimen. Continue to monitor. Call with any concerns.       Relevant Medications   lisinopril (PRINIVIL,ZESTRIL) 10 MG tablet   Other Relevant Orders   Comprehensive metabolic panel   Microalbumin, Urine Waived    Other Visit Diagnoses    Routine general medical examination at a health care facility    -  Primary   Vaccines up to date/declined. Screening labs checked today. Continue diet and exercise. Colonoscopy declined. Continue to monitor. Call with any concerns.    Relevant Orders   CBC with Differential/Platelet   Comprehensive metabolic panel   Lipid Panel w/o Chol/HDL Ratio   TSH   UA/M w/rflx Culture, Routine   Screening for prostate cancer       Labs drawn today. Await results.    Relevant Orders   PSA   Screening for HIV without presence of risk factors       Labs drawn today. Await results.    Relevant Orders   HIV antibody   Need for hepatitis C screening test       Labs drawn today. Await results.    Relevant Orders   Hepatitis C Antibody       Discussed aspirin prophylaxis for myocardial infarction prevention and decision was it was not indicated  LABORATORY TESTING:  Health maintenance labs ordered today as discussed above.   The natural history of prostate cancer and ongoing controversy regarding screening and potential treatment outcomes of prostate cancer has been discussed with the patient. The meaning  of a false positive PSA and a false negative PSA has been discussed. He indicates understanding of the limitations of this screening test and wishes to proceed with screening PSA testing.   IMMUNIZATIONS:   - Tdap: Tetanus vaccination status reviewed: Will do in 6 month. - Influenza: Postponed to flu season - Pneumovax: Refused - Prevnar: Not applicable - Zostavax vaccine: Given elsewhere  SCREENING: - Colonoscopy: Refused  Discussed with patient purpose of the colonoscopy is to detect colon cancer at curable precancerous or early stages    PATIENT COUNSELING:    Sexuality: Discussed sexually transmitted diseases, partner selection, use of condoms, avoidance of unintended pregnancy  and contraceptive alternatives.   Advised to avoid cigarette smoking.  I discussed with the patient that most people either abstain from alcohol or drink within safe limits (<=14/week and <=4 drinks/occasion for males, <=7/weeks and <= 3 drinks/occasion for females) and that the risk for alcohol disorders and other health effects rises proportionally with the number of drinks per week and how often a drinker exceeds daily limits.  Discussed cessation/primary prevention of drug use and availability of treatment for abuse.   Diet: Encouraged to adjust caloric intake to maintain  or achieve ideal body weight, to reduce intake of dietary saturated fat and total fat, to limit sodium intake by avoiding high sodium foods and not adding table salt, and to maintain adequate dietary potassium and calcium preferably from fresh fruits, vegetables, and low-fat dairy products.    stressed the importance of regular exercise  Injury prevention: Discussed safety belts, safety helmets, smoke detector, smoking near bedding or upholstery.   Dental health: Discussed importance of regular tooth brushing, flossing, and dental visits.   Follow up plan: NEXT PREVENTATIVE PHYSICAL DUE IN 1 YEAR. Return in about 6 months (around  05/02/2018) for Follow up.

## 2017-10-30 ENCOUNTER — Ambulatory Visit (INDEPENDENT_AMBULATORY_CARE_PROVIDER_SITE_OTHER): Payer: BLUE CROSS/BLUE SHIELD | Admitting: Family Medicine

## 2017-10-30 ENCOUNTER — Encounter: Payer: Self-pay | Admitting: Family Medicine

## 2017-10-30 VITALS — BP 134/64 | HR 59 | Temp 98.3°F | Ht 66.4 in | Wt 196.2 lb

## 2017-10-30 DIAGNOSIS — Z0000 Encounter for general adult medical examination without abnormal findings: Secondary | ICD-10-CM

## 2017-10-30 DIAGNOSIS — Z125 Encounter for screening for malignant neoplasm of prostate: Secondary | ICD-10-CM | POA: Diagnosis not present

## 2017-10-30 DIAGNOSIS — I10 Essential (primary) hypertension: Secondary | ICD-10-CM | POA: Diagnosis not present

## 2017-10-30 DIAGNOSIS — Z1159 Encounter for screening for other viral diseases: Secondary | ICD-10-CM | POA: Diagnosis not present

## 2017-10-30 DIAGNOSIS — Z114 Encounter for screening for human immunodeficiency virus [HIV]: Secondary | ICD-10-CM

## 2017-10-30 LAB — UA/M W/RFLX CULTURE, ROUTINE
Bilirubin, UA: NEGATIVE
GLUCOSE, UA: NEGATIVE
KETONES UA: NEGATIVE
LEUKOCYTES UA: NEGATIVE
Nitrite, UA: NEGATIVE
PROTEIN UA: NEGATIVE
RBC, UA: NEGATIVE
Specific Gravity, UA: 1.015 (ref 1.005–1.030)
UUROB: 0.2 mg/dL (ref 0.2–1.0)
pH, UA: 6 (ref 5.0–7.5)

## 2017-10-30 LAB — MICROALBUMIN, URINE WAIVED
CREATININE, URINE WAIVED: 200 mg/dL (ref 10–300)
Microalb, Ur Waived: 10 mg/L (ref 0–19)

## 2017-10-30 MED ORDER — LISINOPRIL 10 MG PO TABS: 10.0000 mg | ORAL_TABLET | Freq: Every day | ORAL | 1 refills | Status: DC

## 2017-10-30 NOTE — Assessment & Plan Note (Signed)
Under good control on recheck. Continue current regimen. Continue to monitor. Call with any concerns.  

## 2017-10-30 NOTE — Patient Instructions (Signed)

## 2017-11-02 ENCOUNTER — Telehealth: Payer: Self-pay | Admitting: Family Medicine

## 2017-11-02 DIAGNOSIS — Z789 Other specified health status: Secondary | ICD-10-CM

## 2017-11-02 LAB — CBC WITH DIFFERENTIAL/PLATELET
Basophils Absolute: 0 10*3/uL (ref 0.0–0.2)
Basos: 0 %
EOS (ABSOLUTE): 0.2 10*3/uL (ref 0.0–0.4)
EOS: 2 %
HEMATOCRIT: 43.4 % (ref 37.5–51.0)
Hemoglobin: 14.8 g/dL (ref 13.0–17.7)
Immature Grans (Abs): 0.1 10*3/uL (ref 0.0–0.1)
Immature Granulocytes: 1 %
LYMPHS ABS: 2.2 10*3/uL (ref 0.7–3.1)
Lymphs: 24 %
MCH: 31.6 pg (ref 26.6–33.0)
MCHC: 34.1 g/dL (ref 31.5–35.7)
MCV: 93 fL (ref 79–97)
MONOS ABS: 0.8 10*3/uL (ref 0.1–0.9)
Monocytes: 8 %
Neutrophils Absolute: 6 10*3/uL (ref 1.4–7.0)
Neutrophils: 65 %
Platelets: 258 10*3/uL (ref 150–450)
RBC: 4.68 x10E6/uL (ref 4.14–5.80)
RDW: 13.7 % (ref 12.3–15.4)
WBC: 9.2 10*3/uL (ref 3.4–10.8)

## 2017-11-02 LAB — COMPREHENSIVE METABOLIC PANEL
A/G RATIO: 1.8 (ref 1.2–2.2)
ALK PHOS: 84 IU/L (ref 39–117)
ALT: 14 IU/L (ref 0–44)
AST: 14 IU/L (ref 0–40)
Albumin: 4.4 g/dL (ref 3.5–5.5)
BUN/Creatinine Ratio: 13 (ref 9–20)
BUN: 16 mg/dL (ref 6–24)
Bilirubin Total: 0.5 mg/dL (ref 0.0–1.2)
CO2: 24 mmol/L (ref 20–29)
Calcium: 9.9 mg/dL (ref 8.7–10.2)
Chloride: 102 mmol/L (ref 96–106)
Creatinine, Ser: 1.19 mg/dL (ref 0.76–1.27)
GFR calc Af Amer: 78 mL/min/{1.73_m2} (ref >59)
GFR calc non Af Amer: 68 mL/min/{1.73_m2} (ref >59)
GLOBULIN, TOTAL: 2.5 g/dL (ref 1.5–4.5)
Glucose: 82 mg/dL (ref 65–99)
POTASSIUM: 4.7 mmol/L (ref 3.5–5.2)
SODIUM: 140 mmol/L (ref 134–144)
Total Protein: 6.9 g/dL (ref 6.0–8.5)

## 2017-11-02 LAB — LIPID PANEL W/O CHOL/HDL RATIO
CHOLESTEROL TOTAL: 184 mg/dL (ref 100–199)
HDL: 51 mg/dL (ref >39)
LDL Calculated: 76 mg/dL (ref 0–99)
TRIGLYCERIDES: 283 mg/dL — AB (ref 0–149)
VLDL Cholesterol Cal: 57 mg/dL — ABNORMAL HIGH (ref 5–40)

## 2017-11-02 LAB — RNA QUALITATIVE: HIV 1 RNA Qualitative: NEGATIVE

## 2017-11-02 LAB — HIV 1/2 AB DIFFERENTIATION
HIV 1 Ab: NEGATIVE
HIV 2 AB: NEGATIVE
NOTE (HIV CONF MULTISPOT): NEGATIVE

## 2017-11-02 LAB — HEPATITIS C ANTIBODY: Hep C Virus Ab: <0.1 s/co ratio (ref 0.0–0.9)

## 2017-11-02 LAB — PSA: PROSTATE SPECIFIC AG, SERUM: 2.6 ng/mL (ref 0.0–4.0)

## 2017-11-02 LAB — TSH: TSH: 1.03 u[IU]/mL (ref 0.450–4.500)

## 2017-11-02 LAB — HIV ANTIBODY (ROUTINE TESTING W REFLEX): HIV SCREEN 4TH GENERATION: REACTIVE — AB

## 2017-11-02 NOTE — Telephone Encounter (Signed)
Called and discussed blood work with him including FALSE POSITIVE HIV screen.

## 2018-04-30 ENCOUNTER — Other Ambulatory Visit: Payer: Self-pay | Admitting: Family Medicine

## 2018-04-30 NOTE — Telephone Encounter (Signed)
Courtesy refill; appt 05/09/18

## 2018-05-03 ENCOUNTER — Ambulatory Visit: Payer: BLUE CROSS/BLUE SHIELD | Admitting: Family Medicine

## 2018-05-09 ENCOUNTER — Encounter: Payer: Self-pay | Admitting: Family Medicine

## 2018-05-09 ENCOUNTER — Other Ambulatory Visit: Payer: Self-pay

## 2018-05-09 ENCOUNTER — Ambulatory Visit: Payer: BLUE CROSS/BLUE SHIELD | Admitting: Family Medicine

## 2018-05-09 VITALS — BP 131/82 | HR 72 | Temp 98.3°F | Ht 68.0 in | Wt 215.0 lb

## 2018-05-09 DIAGNOSIS — I10 Essential (primary) hypertension: Secondary | ICD-10-CM

## 2018-05-09 DIAGNOSIS — R197 Diarrhea, unspecified: Secondary | ICD-10-CM

## 2018-05-09 MED ORDER — LISINOPRIL-HYDROCHLOROTHIAZIDE 10-12.5 MG PO TABS: 1.0000 | ORAL_TABLET | Freq: Every day | ORAL | 1 refills | Status: DC

## 2018-05-09 NOTE — Progress Notes (Signed)
BP 131/82   Pulse 72   Temp 98.3 F (36.8 C) (Oral)   Ht 5\' 8"  (1.727 m)   Wt 215 lb (97.5 kg)   SpO2 95%   BMI 32.69 kg/m    Subjective:    Patient ID: Douglas Murillo, male    DOB: 12/17/1960, 57 y.o.   MRN: [id number]  HPI: Douglas Murillo is a 57 y.o. male  Chief Complaint  Patient presents with  . Hypertension    2m f/u  . Diarrhea    x a couple of days   HYPERTENSION Hypertension status: controlled  Satisfied with current treatment? yes Duration of hypertension: chronic BP monitoring frequency:  not checking BP medication side effects:  no Medication compliance: excellent compliance Previous BP meds: lisinopril Aspirin: no Recurrent headaches: no Visual changes: no Palpitations: no Dyspnea: no Chest pain: no Lower extremity edema: yes Dizzy/lightheaded: no  Relevant past medical, surgical, family and social history reviewed and updated as indicated. Interim medical history since our last visit reviewed. Allergies and medications reviewed and updated.  Review of Systems  Constitutional: Negative.   Respiratory: Negative.   Cardiovascular: Positive for leg swelling. Negative for chest pain and palpitations.  Gastrointestinal: Positive for diarrhea. Negative for abdominal distention, abdominal pain, anal bleeding, blood in stool, constipation, nausea, rectal pain and vomiting.  Psychiatric/Behavioral: Negative.     Per HPI unless specifically indicated above     Objective:    BP 131/82   Pulse 72   Temp 98.3 F (36.8 C) (Oral)   Ht 5\' 8"  (1.727 m)   Wt 215 lb (97.5 kg)   SpO2 95%   BMI 32.69 kg/m   Wt Readings from Last 3 Encounters:  05/09/18 215 lb (97.5 kg)  10/30/17 196 lb 4 oz (89 kg)  04/09/17 205 lb (93 kg)    Physical Exam  Constitutional: He is oriented to person, place, and time. He appears well-developed and well-nourished. No distress.  HENT:  Head: Normocephalic and atraumatic.  Right Ear: Hearing normal.  Left  Ear: Hearing normal.  Nose: Nose normal.  Eyes: Conjunctivae and lids are normal. Right eye exhibits no discharge. Left eye exhibits no discharge. No scleral icterus.  Cardiovascular: Normal rate, regular rhythm, normal heart sounds and intact distal pulses. Exam reveals no gallop and no friction rub.  No murmur heard. Pulmonary/Chest: Effort normal and breath sounds normal. No stridor. No respiratory distress. He has no wheezes. He has no rales.  Musculoskeletal: Normal range of motion.  Neurological: He is alert and oriented to person, place, and time.  Skin: Skin is warm, dry and intact. No rash noted. He is not diaphoretic. No erythema. No pallor.  Psychiatric: He has a normal mood and affect. His speech is normal and behavior is normal. Judgment and thought content normal. Cognition and memory are normal.  Nursing note and vitals reviewed.   Results for orders placed or performed in visit on 10/30/17  CBC with Differential/Platelet  Result Value Ref Range   WBC 9.2 3.4 - 10.8 x10E3/uL   RBC 4.68 4.14 - 5.80 x10E6/uL   Hemoglobin 14.8 13.0 - 17.7 g/dL   Hematocrit 43.4 37.5 - 51.0 %   MCV 93 79 - 97 fL   MCH 31.6 26.6 - 33.0 pg   MCHC 34.1 31.5 - 35.7 g/dL   RDW 13.7 12.3 - 15.4 %   Platelets 258 150 - 450 x10E3/uL   Neutrophils 65 Not Estab. %   Lymphs 24 Not Estab. %  Monocytes 8 Not Estab. %   Eos 2 Not Estab. %   Basos 0 Not Estab. %   Neutrophils Absolute 6.0 1.4 - 7.0 x10E3/uL   Lymphocytes Absolute 2.2 0.7 - 3.1 x10E3/uL   Monocytes Absolute 0.8 0.1 - 0.9 x10E3/uL   EOS (ABSOLUTE) 0.2 0.0 - 0.4 x10E3/uL   Basophils Absolute 0.0 0.0 - 0.2 x10E3/uL   Immature Granulocytes 1 Not Estab. %   Immature Grans (Abs) 0.1 0.0 - 0.1 x10E3/uL  Comprehensive metabolic panel  Result Value Ref Range   Glucose 82 65 - 99 mg/dL   BUN 16 6 - 24 mg/dL   Creatinine, Ser 1.19 0.76 - 1.27 mg/dL   GFR calc non Af Amer 68 >59 mL/min/1.73   GFR calc Af Amer 78 >59 mL/min/1.73    BUN/Creatinine Ratio 13 9 - 20   Sodium 140 134 - 144 mmol/L   Potassium 4.7 3.5 - 5.2 mmol/L   Chloride 102 96 - 106 mmol/L   CO2 24 20 - 29 mmol/L   Calcium 9.9 8.7 - 10.2 mg/dL   Total Protein 6.9 6.0 - 8.5 g/dL   Albumin 4.4 3.5 - 5.5 g/dL   Globulin, Total 2.5 1.5 - 4.5 g/dL   Albumin/Globulin Ratio 1.8 1.2 - 2.2   Bilirubin Total 0.5 0.0 - 1.2 mg/dL   Alkaline Phosphatase 84 39 - 117 IU/L   AST 14 0 - 40 IU/L   ALT 14 0 - 44 IU/L  Lipid Panel w/o Chol/HDL Ratio  Result Value Ref Range   Cholesterol, Total 184 100 - 199 mg/dL   Triglycerides 283 (H) 0 - 149 mg/dL   HDL 51 >39 mg/dL   VLDL Cholesterol Cal 57 (H) 5 - 40 mg/dL   LDL Calculated 76 0 - 99 mg/dL  Microalbumin, Urine Waived  Result Value Ref Range   Microalb, Ur Waived 10 0 - 19 mg/L   Creatinine, Urine Waived 200 10 - 300 mg/dL   Microalb/Creat Ratio <30 <30 mg/g  PSA  Result Value Ref Range   Prostate Specific Ag, Serum 2.6 0.0 - 4.0 ng/mL  TSH  Result Value Ref Range   TSH 1.030 0.450 - 4.500 uIU/mL  UA/M w/rflx Culture, Routine  Result Value Ref Range   Specific Gravity, UA 1.015 1.005 - 1.030   pH, UA 6.0 5.0 - 7.5   Color, UA Yellow Yellow   Appearance Ur Clear Clear   Leukocytes, UA Negative Negative   Protein, UA Negative Negative/Trace   Glucose, UA Negative Negative   Ketones, UA Negative Negative   RBC, UA Negative Negative   Bilirubin, UA Negative Negative   Urobilinogen, Ur 0.2 0.2 - 1.0 mg/dL   Nitrite, UA Negative Negative  HIV antibody  Result Value Ref Range   HIV Screen 4th Generation wRfx Reactive (A) Non Reactive  Hepatitis C Antibody  Result Value Ref Range   Hep C Virus Ab <0.1 0.0 - 0.9 s/co ratio  HIV 1/2 Ab Differentiation  Result Value Ref Range   HIV 1 Ab Negative Negative   HIV 2 Ab Negative Negative   NOTE (HIV CONF MULTIP Negative   RNA Qualitative  Result Value Ref Range   HIV 1 RNA Qualitative Negative Negative   Final Interpretation Comment       Assessment  & Plan:   Problem List Items Addressed This Visit      Cardiovascular and Mediastinum   HTN (hypertension) - Primary    Under good control, but has been  having some edema- will add HCTZ and recheck 2-3 months. Call with any concerns. BMP checked today.      Relevant Medications   lisinopril-hydrochlorothiazide (PRINZIDE,ZESTORETIC) 10-12.5 MG tablet   Other Relevant Orders   Basic metabolic panel    Other Visit Diagnoses    Diarrhea, unspecified type       Likely viral/functional- call if not getting better or getting worse.        Follow up plan: Return 2-3 months, for follow up BP.

## 2018-05-09 NOTE — Assessment & Plan Note (Signed)
Under good control, but has been having some edema- will add HCTZ and recheck 2-3 months. Call with any concerns. BMP checked today.

## 2018-05-10 ENCOUNTER — Encounter: Payer: Self-pay | Admitting: Family Medicine

## 2018-05-10 LAB — BASIC METABOLIC PANEL
BUN/Creatinine Ratio: 9 (ref 9–20)
BUN: 12 mg/dL (ref 6–24)
CALCIUM: 9.4 mg/dL (ref 8.7–10.2)
CHLORIDE: 101 mmol/L (ref 96–106)
CO2: 22 mmol/L (ref 20–29)
CREATININE: 1.35 mg/dL — AB (ref 0.76–1.27)
GFR calc Af Amer: 67 mL/min/{1.73_m2} (ref >59)
GFR calc non Af Amer: 58 mL/min/{1.73_m2} — ABNORMAL LOW (ref >59)
GLUCOSE: 92 mg/dL (ref 65–99)
Potassium: 4.4 mmol/L (ref 3.5–5.2)
Sodium: 138 mmol/L (ref 134–144)

## 2018-08-09 ENCOUNTER — Other Ambulatory Visit: Payer: Self-pay

## 2018-08-09 ENCOUNTER — Ambulatory Visit: Payer: BLUE CROSS/BLUE SHIELD | Admitting: Family Medicine

## 2018-08-09 ENCOUNTER — Encounter: Payer: Self-pay | Admitting: Family Medicine

## 2018-08-09 VITALS — BP 106/68 | HR 64 | Temp 98.9°F | Ht 68.0 in | Wt 219.0 lb

## 2018-08-09 DIAGNOSIS — R202 Paresthesia of skin: Secondary | ICD-10-CM

## 2018-08-09 DIAGNOSIS — I10 Essential (primary) hypertension: Secondary | ICD-10-CM | POA: Diagnosis not present

## 2018-08-09 DIAGNOSIS — G629 Polyneuropathy, unspecified: Secondary | ICD-10-CM

## 2018-08-09 MED ORDER — NORTRIPTYLINE HCL 25 MG PO CAPS: 25.0000 mg | ORAL_CAPSULE | Freq: Every day | ORAL | 1 refills | Status: DC

## 2018-08-09 MED ORDER — LISINOPRIL-HYDROCHLOROTHIAZIDE 10-12.5 MG PO TABS: 1.0000 | ORAL_TABLET | Freq: Every day | ORAL | 1 refills | Status: DC

## 2018-08-09 NOTE — Assessment & Plan Note (Signed)
Will check A1c and start nortriptyline. Call with any concerns. Continue to monitor.

## 2018-08-09 NOTE — Progress Notes (Signed)
BP 106/68   Pulse 64   Temp 98.9 F (37.2 C) (Oral)   Ht 5\' 8"  (1.727 m)   Wt 219 lb (99.3 kg)   SpO2 97%   BMI 33.30 kg/m    Subjective:    Patient ID: Douglas Murillo, male    DOB: January 03, 1961, 58 y.o.   MRN: [id number]  HPI: Douglas Murillo is a 58 y.o. male  Chief Complaint  Patient presents with  . Hypertension    f/u   HYPERTENSION Hypertension status: controlled  Satisfied with current treatment? yes Duration of hypertension: chronic BP monitoring frequency:  not checking BP range:  BP medication side effects:  no Medication compliance: excellent compliance Previous BP meds:lisinopril-HCTZ Aspirin: no Recurrent headaches: no Visual changes: no Palpitations: no Dyspnea: no Chest pain: no Lower extremity edema: no Dizzy/lightheaded: no   NEUROPATHY Neuropathy status: uncontrolled  Satisfied with current treatment?: no Medication side effects: Not on anything Location: bilateral feet Pain: yes Severity: moderate  Quality:  Numb and tingling Frequency: constant Bilateral: yes Symmetric: yes Numbness: yes Decreased sensation: yes Weakness: no Context: worse  Relevant past medical, surgical, family and social history reviewed and updated as indicated. Interim medical history since our last visit reviewed. Allergies and medications reviewed and updated.  Review of Systems  Constitutional: Negative.   Respiratory: Negative.   Cardiovascular: Negative.   Musculoskeletal: Negative.   Skin: Negative.   Neurological: Positive for numbness. Negative for dizziness, tremors, seizures, syncope, facial asymmetry, speech difficulty, weakness, light-headedness and headaches.  Hematological: Negative.   Psychiatric/Behavioral: Negative.     Per HPI unless specifically indicated above     Objective:    BP 106/68   Pulse 64   Temp 98.9 F (37.2 C) (Oral)   Ht 5\' 8"  (1.727 m)   Wt 219 lb (99.3 kg)   SpO2 97%   BMI 33.30 kg/m   Wt Readings  from Last 3 Encounters:  08/09/18 219 lb (99.3 kg)  05/09/18 215 lb (97.5 kg)  10/30/17 196 lb 4 oz (89 kg)    Physical Exam Vitals signs and nursing note reviewed.  Constitutional:      General: He is not in acute distress.    Appearance: Normal appearance. He is not ill-appearing, toxic-appearing or diaphoretic.  HENT:     Head: Normocephalic and atraumatic.     Right Ear: External ear normal.     Left Ear: External ear normal.     Nose: Nose normal.     Mouth/Throat:     Mouth: Mucous membranes are moist.     Pharynx: Oropharynx is clear.  Eyes:     General: No scleral icterus.       Right eye: No discharge.        Left eye: No discharge.     Extraocular Movements: Extraocular movements intact.     Conjunctiva/sclera: Conjunctivae normal.     Pupils: Pupils are equal, round, and reactive to light.  Neck:     Musculoskeletal: Normal range of motion and neck supple.  Cardiovascular:     Rate and Rhythm: Normal rate and regular rhythm.     Pulses: Normal pulses.     Heart sounds: Normal heart sounds. No murmur. No friction rub. No gallop.   Pulmonary:     Effort: Pulmonary effort is normal. No respiratory distress.     Breath sounds: Normal breath sounds. No stridor. No wheezing, rhonchi or rales.  Chest:     Chest wall: No tenderness.  Musculoskeletal: Normal range of motion.  Skin:    General: Skin is warm and dry.     Capillary Refill: Capillary refill takes less than 2 seconds.     Coloration: Skin is not jaundiced or pale.     Findings: No bruising, erythema, lesion or rash.  Neurological:     General: No focal deficit present.     Mental Status: He is alert and oriented to person, place, and time. Mental status is at baseline.  Psychiatric:        Mood and Affect: Mood normal.        Behavior: Behavior normal.        Thought Content: Thought content normal.        Judgment: Judgment normal.     Results for orders placed or performed in visit on 76/22/63    Basic metabolic panel  Result Value Ref Range   Glucose 92 65 - 99 mg/dL   BUN 12 6 - 24 mg/dL   Creatinine, Ser 1.35 (H) 0.76 - 1.27 mg/dL   GFR calc non Af Amer 58 (L) >59 mL/min/1.73   GFR calc Af Amer 67 >59 mL/min/1.73   BUN/Creatinine Ratio 9 9 - 20   Sodium 138 134 - 144 mmol/L   Potassium 4.4 3.5 - 5.2 mmol/L   Chloride 101 96 - 106 mmol/L   CO2 22 20 - 29 mmol/L   Calcium 9.4 8.7 - 10.2 mg/dL      Assessment & Plan:   Problem List Items Addressed This Visit      Cardiovascular and Mediastinum   HTN (hypertension) - Primary    Under good control on current regimen. Continue current regimen. Continue to monitor. Call with any concerns. Refills given. Labs drawn today.       Relevant Medications   lisinopril-hydrochlorothiazide (PRINZIDE,ZESTORETIC) 10-12.5 MG tablet   Other Relevant Orders   Basic metabolic panel     Nervous and Auditory   Neuropathy    Will check A1c and start nortriptyline. Call with any concerns. Continue to monitor.        Other Visit Diagnoses    Paresthesia       Will check A1c. Await results. Call with any concerns.    Relevant Orders   Hgb A1c w/o eAG       Follow up plan: Return in about 6 months (around 02/09/2019) for Physical.

## 2018-08-09 NOTE — Assessment & Plan Note (Signed)
Under good control on current regimen. Continue current regimen. Continue to monitor. Call with any concerns. Refills given. Labs drawn today.   

## 2018-08-10 LAB — BASIC METABOLIC PANEL
BUN/Creatinine Ratio: 12 (ref 9–20)
BUN: 19 mg/dL (ref 6–24)
CO2: 25 mmol/L (ref 20–29)
Calcium: 9.6 mg/dL (ref 8.7–10.2)
Chloride: 103 mmol/L (ref 96–106)
Creatinine, Ser: 1.55 mg/dL — ABNORMAL HIGH (ref 0.76–1.27)
GFR calc Af Amer: 57 mL/min/{1.73_m2} — ABNORMAL LOW (ref >59)
GFR calc non Af Amer: 49 mL/min/{1.73_m2} — ABNORMAL LOW (ref >59)
Glucose: 61 mg/dL — ABNORMAL LOW (ref 65–99)
Potassium: 4.8 mmol/L (ref 3.5–5.2)
Sodium: 140 mmol/L (ref 134–144)

## 2018-08-10 LAB — HGB A1C W/O EAG: Hgb A1c MFr Bld: 5.4 % (ref 4.8–5.6)

## 2018-08-12 ENCOUNTER — Encounter: Payer: Self-pay | Admitting: Family Medicine

## 2018-12-17 ENCOUNTER — Telehealth: Payer: Self-pay | Admitting: Family Medicine

## 2018-12-17 MED ORDER — LISINOPRIL 10 MG PO TABS: 10.0000 mg | ORAL_TABLET | Freq: Every day | ORAL | 0 refills | Status: DC

## 2018-12-17 MED ORDER — HYDROCHLOROTHIAZIDE 12.5 MG PO CAPS: 12.5000 mg | ORAL_CAPSULE | Freq: Every day | ORAL | 0 refills | Status: DC

## 2018-12-17 NOTE — Telephone Encounter (Signed)
Fax from pharmacy requesting lisinopril-HCTZ be split due to backorder. Rx split.

## 2019-02-04 ENCOUNTER — Other Ambulatory Visit: Payer: Self-pay | Admitting: Family Medicine

## 2019-02-14 ENCOUNTER — Ambulatory Visit (INDEPENDENT_AMBULATORY_CARE_PROVIDER_SITE_OTHER): Payer: BLUE CROSS/BLUE SHIELD | Admitting: Family Medicine

## 2019-02-14 ENCOUNTER — Encounter: Payer: Self-pay | Admitting: Family Medicine

## 2019-02-14 ENCOUNTER — Other Ambulatory Visit: Payer: Self-pay

## 2019-02-14 VITALS — BP 120/75 | HR 86 | Temp 99.0°F

## 2019-02-14 DIAGNOSIS — Z0000 Encounter for general adult medical examination without abnormal findings: Secondary | ICD-10-CM | POA: Diagnosis not present

## 2019-02-14 DIAGNOSIS — Z1211 Encounter for screening for malignant neoplasm of colon: Secondary | ICD-10-CM | POA: Diagnosis not present

## 2019-02-14 DIAGNOSIS — I10 Essential (primary) hypertension: Secondary | ICD-10-CM

## 2019-02-14 LAB — UA/M W/RFLX CULTURE, ROUTINE
Bilirubin, UA: NEGATIVE
Glucose, UA: NEGATIVE
Leukocytes,UA: NEGATIVE
Nitrite, UA: NEGATIVE
Protein,UA: NEGATIVE
RBC, UA: NEGATIVE
Specific Gravity, UA: 1.02 (ref 1.005–1.030)
Urobilinogen, Ur: 0.2 mg/dL (ref 0.2–1.0)
pH, UA: 5 (ref 5.0–7.5)

## 2019-02-14 LAB — MICROALBUMIN, URINE WAIVED
Creatinine, Urine Waived: 300 mg/dL (ref 10–300)
Microalb, Ur Waived: 10 mg/L (ref 0–19)
Microalb/Creat Ratio: <30 mg/g (ref <30)

## 2019-02-14 MED ORDER — LISINOPRIL 10 MG PO TABS: 10.0000 mg | ORAL_TABLET | Freq: Every day | ORAL | 1 refills | Status: DC

## 2019-02-14 MED ORDER — HYDROCHLOROTHIAZIDE 12.5 MG PO CAPS: 12.5000 mg | ORAL_CAPSULE | Freq: Every day | ORAL | 1 refills | Status: DC

## 2019-02-14 NOTE — Assessment & Plan Note (Signed)
Under good control on current regimen. Continue current regimen. Continue to monitor. Call with any concerns. Refills given. Labs drawn.   

## 2019-02-14 NOTE — Progress Notes (Signed)
BP 120/75   Pulse 86   Temp 99 F (37.2 C)   SpO2 96%    Subjective:    Patient ID: Douglas Murillo, male    DOB: January 06, 1961, 58 y.o.   MRN: [id number]  HPI: Douglas Murillo is a 58 y.o. male presenting on 02/14/2019 for comprehensive medical examination. Current medical complaints include:  HYPERTENSION Hypertension status: controlled  Satisfied with current treatment? yes Duration of hypertension: chronic BP monitoring frequency:  not checking BP medication side effects:  no Medication compliance: excellent compliance Previous BP meds: lisinopril, HCTZ Aspirin: no Recurrent headaches: no Visual changes: no Palpitations: no Dyspnea: no Chest pain: no Lower extremity edema: no Dizzy/lightheaded: no  He currently lives with:wife Interim Problems from his last visit: no  Depression Screen done today and results listed below:  Depression screen [facility] 2/9 02/14/2019 10/30/2017 08/01/2016  Decreased Interest 0 0 0  Down, Depressed, Hopeless 0 0 0  PHQ - 2 Score 0 0 0  Altered sleeping - 1 -  Tired, decreased energy - 1 -  Change in appetite - 0 -  Feeling bad or failure about yourself  - 0 -  Trouble concentrating - 0 -  Moving slowly or fidgety/restless - 0 -  Suicidal thoughts - 0 -  PHQ-9 Score - 2 -  Difficult doing work/chores - Not difficult at all -    Past Medical History:  Past Medical History:  Diagnosis Date  . GERD (gastroesophageal reflux disease)   . Hypertension     Surgical History:  Past Surgical History:  Procedure Laterality Date  . ANAL FISSURE REPAIR    . CHOLECYSTECTOMY      Medications:  Current Outpatient Medications on File Prior to Visit  Medication Sig  . Acetaminophen (TYLENOL 8 HOUR PO) Take by mouth as needed.  Marland Kitchen esomeprazole (NEXIUM) 20 MG capsule Take 20 mg by mouth daily at 12 noon.  . Multiple Vitamins-Minerals (MULTIVITAMIN PO) Take by mouth daily.  . nortriptyline (PAMELOR) 25 MG capsule TAKE 1 CAPSULE(25 MG) BY  MOUTH AT BEDTIME   No current facility-administered medications on file prior to visit.     Allergies:  Allergies  Allergen Reactions  . Prednisone Other (See Comments)    Joint Pain    Social History:  Social History   Socioeconomic History  . Marital status: Married    Spouse name: Not on file  . Number of children: Not on file  . Years of education: Not on file  . Highest education level: Not on file  Occupational History  . Not on file  Social Needs  . Financial resource strain: Not on file  . Food insecurity    Worry: Not on file    Inability: Not on file  . Transportation needs    Medical: Not on file    Non-medical: Not on file  Tobacco Use  . Smoking status: Former Smoker    Types: Cigarettes    Quit date: 12/20/2015    Years since quitting: 3.1  . Smokeless tobacco: Never Used  Substance and Sexual Activity  . Alcohol use: No  . Drug use: Yes    Types: Marijuana  . Sexual activity: Yes  Lifestyle  . Physical activity    Days per week: Not on file    Minutes per session: Not on file  . Stress: Not on file  Relationships  . Social Herbalist on phone: Not on file    Gets together:  Not on file    Attends religious service: Not on file    Active member of club or organization: Not on file    Attends meetings of clubs or organizations: Not on file    Relationship status: Not on file  . Intimate partner violence    Fear of current or ex partner: Not on file    Emotionally abused: Not on file    Physically abused: Not on file    Forced sexual activity: Not on file  Other Topics Concern  . Not on file  Social History Narrative  . Not on file   Social History   Tobacco Use  Smoking Status Former Smoker  . Types: Cigarettes  . Quit date: 12/20/2015  . Years since quitting: 3.1  Smokeless Tobacco Never Used   Social History   Substance and Sexual Activity  Alcohol Use No    Family History:  Family History  Problem Relation Age of  Onset  . Arthritis Mother   . Cancer Mother        breast  . Cancer Father        lymphoma  . Diabetes Father   . Hearing loss Father   . Heart disease Father   . Hypertension Father   . Stroke Maternal Grandmother   . Arthritis Maternal Grandmother   . Birth defects Maternal Grandfather   . Arthritis Paternal Grandmother   . Heart disease Paternal Grandfather   . Graves' disease Daughter     Past medical history, surgical history, medications, allergies, family history and social history reviewed with patient today and changes made to appropriate areas of the chart.   Review of Systems  Constitutional: Negative.   HENT: Negative.   Eyes: Negative.   Respiratory: Negative.   Cardiovascular: Positive for leg swelling. Negative for chest pain, palpitations, orthopnea, claudication and PND.  Gastrointestinal: Positive for heartburn. Negative for abdominal pain, blood in stool, constipation, diarrhea, melena, nausea and vomiting.  Genitourinary: Negative.   Musculoskeletal: Negative.   Skin: Negative.   Neurological: Negative.   Endo/Heme/Allergies: Positive for environmental allergies. Negative for polydipsia. Does not bruise/bleed easily.  Psychiatric/Behavioral: Negative.     All other ROS negative except what is listed above and in the HPI.      Objective:    BP 120/75   Pulse 86   Temp 99 F (37.2 C)   SpO2 96%   Wt Readings from Last 3 Encounters:  08/09/18 219 lb (99.3 kg)  05/09/18 215 lb (97.5 kg)  10/30/17 196 lb 4 oz (89 kg)    Physical Exam Vitals signs and nursing note reviewed.  Constitutional:      General: He is not in acute distress.    Appearance: Normal appearance. He is obese. He is not ill-appearing, toxic-appearing or diaphoretic.  HENT:     Head: Normocephalic and atraumatic.     Right Ear: Tympanic membrane, ear canal and external ear normal. There is no impacted cerumen.     Left Ear: Tympanic membrane, ear canal and external ear normal.  There is no impacted cerumen.     Nose: Nose normal. No congestion or rhinorrhea.     Mouth/Throat:     Mouth: Mucous membranes are moist.     Pharynx: Oropharynx is clear. No oropharyngeal exudate or posterior oropharyngeal erythema.  Eyes:     General: No scleral icterus.       Right eye: No discharge.        Left eye: No  discharge.     Extraocular Movements: Extraocular movements intact.     Conjunctiva/sclera: Conjunctivae normal.     Pupils: Pupils are equal, round, and reactive to light.  Neck:     Musculoskeletal: Normal range of motion and neck supple. No neck rigidity or muscular tenderness.     Vascular: No carotid bruit.  Cardiovascular:     Rate and Rhythm: Normal rate and regular rhythm.     Pulses: Normal pulses.     Heart sounds: No murmur. No friction rub. No gallop.   Pulmonary:     Effort: Pulmonary effort is normal. No respiratory distress.     Breath sounds: Normal breath sounds. No stridor. No wheezing, rhonchi or rales.  Chest:     Chest wall: No tenderness.  Abdominal:     General: Abdomen is flat. Bowel sounds are normal. There is no distension.     Palpations: Abdomen is soft. There is no mass.     Tenderness: There is no abdominal tenderness. There is no right CVA tenderness, left CVA tenderness, guarding or rebound.     Hernia: No hernia is present.  Genitourinary:    Comments: Genital exam deferred with shared decision making Musculoskeletal:        General: No swelling, tenderness, deformity or signs of injury.     Right lower leg: No edema.     Left lower leg: No edema.  Lymphadenopathy:     Cervical: No cervical adenopathy.  Skin:    General: Skin is warm and dry.     Capillary Refill: Capillary refill takes less than 2 seconds.     Coloration: Skin is not jaundiced or pale.     Findings: No bruising, erythema, lesion or rash.  Neurological:     General: No focal deficit present.     Mental Status: He is alert and oriented to person, place,  and time.     Cranial Nerves: No cranial nerve deficit.     Sensory: No sensory deficit.     Motor: No weakness.     Coordination: Coordination normal.     Gait: Gait normal.     Deep Tendon Reflexes: Reflexes normal.  Psychiatric:        Mood and Affect: Mood normal.        Behavior: Behavior normal.        Thought Content: Thought content normal.        Judgment: Judgment normal.     Results for orders placed or performed in visit on [id number]  Basic metabolic panel  Result Value Ref Range   Glucose 61 (L) 65 - 99 mg/dL   BUN 19 6 - 24 mg/dL   Creatinine, Ser 1.55 (H) 0.76 - 1.27 mg/dL   GFR calc non Af Amer 49 (L) >59 mL/min/1.73   GFR calc Af Amer 57 (L) >59 mL/min/1.73   BUN/Creatinine Ratio 12 9 - 20   Sodium 140 134 - 144 mmol/L   Potassium 4.8 3.5 - 5.2 mmol/L   Chloride 103 96 - 106 mmol/L   CO2 25 20 - 29 mmol/L   Calcium 9.6 8.7 - 10.2 mg/dL  Hgb A1c w/o eAG  Result Value Ref Range   Hgb A1c MFr Bld 5.4 4.8 - 5.6 %      Assessment & Plan:   Problem List Items Addressed This Visit      Cardiovascular and Mediastinum   HTN (hypertension)    Under good control on current regimen. Continue current regimen.  Continue to monitor. Call with any concerns. Refills given. Labs drawn.        Relevant Medications   lisinopril (ZESTRIL) 10 MG tablet   hydrochlorothiazide (MICROZIDE) 12.5 MG capsule   Other Relevant Orders   Comprehensive metabolic panel   Microalbumin, Urine Waived    Other Visit Diagnoses    Routine general medical examination at a health care facility    -  Primary   Vaccines up to date. Screening labs checked today. Cologuard ordered. Continue diet and exercise. Call with any concerns.    Relevant Orders   CBC with Differential/Platelet   Comprehensive metabolic panel   Lipid Panel w/o Chol/HDL Ratio   PSA   TSH   UA/M w/rflx Culture, Routine   Screening for colon cancer       Cologuard given today. Await results.    Relevant Orders    Cologuard      LABORATORY TESTING:  Health maintenance labs ordered today as discussed above.   The natural history of prostate cancer and ongoing controversy regarding screening and potential treatment outcomes of prostate cancer has been discussed with the patient. The meaning of a false positive PSA and a false negative PSA has been discussed. He indicates understanding of the limitations of this screening test and wishes to proceed with screening PSA testing.   IMMUNIZATIONS:   - Tdap: Tetanus vaccination status reviewed: Refused. - Influenza: Refused - Pneumovax: Not applicable  SCREENING: - Colonoscopy: cologuard ordered today  Discussed with patient purpose of the colonoscopy is to detect colon cancer at curable precancerous or early stages   PATIENT COUNSELING:    Sexuality: Discussed sexually transmitted diseases, partner selection, use of condoms, avoidance of unintended pregnancy  and contraceptive alternatives.   Advised to avoid cigarette smoking.  I discussed with the patient that most people either abstain from alcohol or drink within safe limits (<=14/week and <=4 drinks/occasion for males, <=7/weeks and <= 3 drinks/occasion for females) and that the risk for alcohol disorders and other health effects rises proportionally with the number of drinks per week and how often a drinker exceeds daily limits.  Discussed cessation/primary prevention of drug use and availability of treatment for abuse.   Diet: Encouraged to adjust caloric intake to maintain  or achieve ideal body weight, to reduce intake of dietary saturated fat and total fat, to limit sodium intake by avoiding high sodium foods and not adding table salt, and to maintain adequate dietary potassium and calcium preferably from fresh fruits, vegetables, and low-fat dairy products.    stressed the importance of regular exercise  Injury prevention: Discussed safety belts, safety helmets, smoke detector, smoking near  bedding or upholstery.   Dental health: Discussed importance of regular tooth brushing, flossing, and dental visits.   Follow up plan: NEXT PREVENTATIVE PHYSICAL DUE IN 1 YEAR. Return in about 6 months (around 08/14/2019) for follow up.

## 2019-02-14 NOTE — Patient Instructions (Signed)

## 2019-02-15 LAB — CBC WITH DIFFERENTIAL/PLATELET
Basophils Absolute: 0.1 10*3/uL (ref 0.0–0.2)
Basos: 1 %
EOS (ABSOLUTE): 0.4 10*3/uL (ref 0.0–0.4)
Eos: 4 %
Hematocrit: 43.1 % (ref 37.5–51.0)
Hemoglobin: 14.7 g/dL (ref 13.0–17.7)
Immature Grans (Abs): 0.1 10*3/uL (ref 0.0–0.1)
Immature Granulocytes: 1 %
Lymphocytes Absolute: 2.4 10*3/uL (ref 0.7–3.1)
Lymphs: 24 %
MCH: 31.3 pg (ref 26.6–33.0)
MCHC: 34.1 g/dL (ref 31.5–35.7)
MCV: 92 fL (ref 79–97)
Monocytes Absolute: 0.8 10*3/uL (ref 0.1–0.9)
Monocytes: 8 %
Neutrophils Absolute: 6.3 10*3/uL (ref 1.4–7.0)
Neutrophils: 62 %
Platelets: 293 10*3/uL (ref 150–450)
RBC: 4.7 x10E6/uL (ref 4.14–5.80)
RDW: 12.6 % (ref 11.6–15.4)
WBC: 10 10*3/uL (ref 3.4–10.8)

## 2019-02-15 LAB — COMPREHENSIVE METABOLIC PANEL
ALT: 26 IU/L (ref 0–44)
AST: 27 IU/L (ref 0–40)
Albumin/Globulin Ratio: 1.4 (ref 1.2–2.2)
Albumin: 4.3 g/dL (ref 3.8–4.9)
Alkaline Phosphatase: 81 IU/L (ref 39–117)
BUN/Creatinine Ratio: 13 (ref 9–20)
BUN: 17 mg/dL (ref 6–24)
Bilirubin Total: 0.4 mg/dL (ref 0.0–1.2)
CO2: 22 mmol/L (ref 20–29)
Calcium: 9.9 mg/dL (ref 8.7–10.2)
Chloride: 101 mmol/L (ref 96–106)
Creatinine, Ser: 1.34 mg/dL — ABNORMAL HIGH (ref 0.76–1.27)
GFR calc Af Amer: 68 mL/min/{1.73_m2} (ref >59)
GFR calc non Af Amer: 58 mL/min/{1.73_m2} — ABNORMAL LOW (ref >59)
Globulin, Total: 3.1 g/dL (ref 1.5–4.5)
Glucose: 108 mg/dL — ABNORMAL HIGH (ref 65–99)
Potassium: 4.8 mmol/L (ref 3.5–5.2)
Sodium: 138 mmol/L (ref 134–144)
Total Protein: 7.4 g/dL (ref 6.0–8.5)

## 2019-02-15 LAB — TSH: TSH: 0.726 u[IU]/mL (ref 0.450–4.500)

## 2019-02-15 LAB — PSA: Prostate Specific Ag, Serum: 4 ng/mL (ref 0.0–4.0)

## 2019-02-15 LAB — LIPID PANEL W/O CHOL/HDL RATIO
Cholesterol, Total: 189 mg/dL (ref 100–199)
HDL: 42 mg/dL (ref >39)
LDL Chol Calc (NIH): 85 mg/dL (ref 0–99)
Triglycerides: 384 mg/dL — ABNORMAL HIGH (ref 0–149)
VLDL Cholesterol Cal: 62 mg/dL — ABNORMAL HIGH (ref 5–40)

## 2019-02-16 ENCOUNTER — Encounter: Payer: Self-pay | Admitting: Family Medicine

## 2019-04-22 ENCOUNTER — Telehealth: Payer: Self-pay

## 2019-04-22 NOTE — Telephone Encounter (Signed)
Called patient to remind him about his cologuard test [facility]. Some one else answer the phone and stated he was at work. Told her that I will call back later to speak with patient.

## 2019-08-05 ENCOUNTER — Other Ambulatory Visit: Payer: Self-pay | Admitting: Family Medicine

## 2019-08-15 ENCOUNTER — Encounter: Payer: Self-pay | Admitting: Family Medicine

## 2019-08-15 ENCOUNTER — Ambulatory Visit (INDEPENDENT_AMBULATORY_CARE_PROVIDER_SITE_OTHER): Payer: 59 | Admitting: Family Medicine

## 2019-08-15 ENCOUNTER — Other Ambulatory Visit: Payer: Self-pay

## 2019-08-15 VITALS — BP 160/85 | HR 72 | Temp 98.0°F

## 2019-08-15 DIAGNOSIS — I10 Essential (primary) hypertension: Secondary | ICD-10-CM

## 2019-08-15 DIAGNOSIS — G629 Polyneuropathy, unspecified: Secondary | ICD-10-CM | POA: Diagnosis not present

## 2019-08-15 MED ORDER — LISINOPRIL 20 MG PO TABS: 20.0000 mg | ORAL_TABLET | Freq: Every day | ORAL | 1 refills | Status: DC

## 2019-08-15 NOTE — Assessment & Plan Note (Signed)
Under good control on current regimen. Continue current regimen. Continue to monitor. Call with any concerns. Refills given.   

## 2019-08-15 NOTE — Assessment & Plan Note (Signed)
Not under good control. Will increase his lisinopril to 20mg  and stop his HCTZ. Call with any concerns. Continue to monitor.

## 2019-08-15 NOTE — Progress Notes (Signed)
BP (!) 160/85 (BP Location: Left Arm, Cuff Size: Normal)   Pulse 72   Temp 98 F (36.7 C) (Oral)   SpO2 95%    Subjective:    Patient ID: Douglas Murillo, male    DOB: 1960/12/17, 59 y.o.   MRN: [id number]  HPI: Douglas Murillo is a 59 y.o. male  Chief Complaint  Patient presents with  . Follow-up    BP   HYPERTENSION- stopped the HCTZ because it was giving him a headache Hypertension status: uncontrolled  Satisfied with current treatment? no Duration of hypertension: chronic BP monitoring frequency:  not checking BP medication side effects:  no Medication compliance: excellent compliance Previous BP meds:lisinopril Aspirin: no Recurrent headaches: no Visual changes: no Palpitations: no Dyspnea: no Chest pain: no Lower extremity edema: no Dizzy/lightheaded: no  Relevant past medical, surgical, family and social history reviewed and updated as indicated. Interim medical history since our last visit reviewed. Allergies and medications reviewed and updated.  Review of Systems  Constitutional: Negative.   Respiratory: Negative.   Cardiovascular: Negative.   Musculoskeletal: Negative.   Psychiatric/Behavioral: Negative.     Per HPI unless specifically indicated above     Objective:    BP (!) 160/85 (BP Location: Left Arm, Cuff Size: Normal)   Pulse 72   Temp 98 F (36.7 C) (Oral)   SpO2 95%   Wt Readings from Last 3 Encounters:  08/09/18 219 lb (99.3 kg)  05/09/18 215 lb (97.5 kg)  10/30/17 196 lb 4 oz (89 kg)    Physical Exam Vitals and nursing note reviewed.  Constitutional:      General: He is not in acute distress.    Appearance: Normal appearance. He is not ill-appearing, toxic-appearing or diaphoretic.  HENT:     Head: Normocephalic and atraumatic.     Right Ear: External ear normal.     Left Ear: External ear normal.     Nose: Nose normal.     Mouth/Throat:     Mouth: Mucous membranes are moist.     Pharynx: Oropharynx is clear.    Eyes:     General: No scleral icterus.       Right eye: No discharge.        Left eye: No discharge.     Extraocular Movements: Extraocular movements intact.     Conjunctiva/sclera: Conjunctivae normal.     Pupils: Pupils are equal, round, and reactive to light.  Cardiovascular:     Rate and Rhythm: Normal rate and regular rhythm.     Pulses: Normal pulses.     Heart sounds: Normal heart sounds. No murmur. No friction rub. No gallop.   Pulmonary:     Effort: Pulmonary effort is normal. No respiratory distress.     Breath sounds: Normal breath sounds. No stridor. No wheezing, rhonchi or rales.  Chest:     Chest wall: No tenderness.  Musculoskeletal:        General: Normal range of motion.     Cervical back: Normal range of motion and neck supple.  Skin:    General: Skin is warm and dry.     Capillary Refill: Capillary refill takes less than 2 seconds.     Coloration: Skin is not jaundiced or pale.     Findings: No bruising, erythema, lesion or rash.  Neurological:     General: No focal deficit present.     Mental Status: He is alert and oriented to person, place, and time. Mental status  is at baseline.  Psychiatric:        Mood and Affect: Mood normal.        Behavior: Behavior normal.        Thought Content: Thought content normal.        Judgment: Judgment normal.     Results for orders placed or performed in visit on 02/14/19  CBC with Differential/Platelet  Result Value Ref Range   WBC 10.0 3.4 - 10.8 x10E3/uL   RBC 4.70 4.14 - 5.80 x10E6/uL   Hemoglobin 14.7 13.0 - 17.7 g/dL   Hematocrit 43.1 37.5 - 51.0 %   MCV 92 79 - 97 fL   MCH 31.3 26.6 - 33.0 pg   MCHC 34.1 31.5 - 35.7 g/dL   RDW 12.6 11.6 - 15.4 %   Platelets 293 150 - 450 x10E3/uL   Neutrophils 62 Not Estab. %   Lymphs 24 Not Estab. %   Monocytes 8 Not Estab. %   Eos 4 Not Estab. %   Basos 1 Not Estab. %   Neutrophils Absolute 6.3 1.4 - 7.0 x10E3/uL   Lymphocytes Absolute 2.4 0.7 - 3.1 x10E3/uL    Monocytes Absolute 0.8 0.1 - 0.9 x10E3/uL   EOS (ABSOLUTE) 0.4 0.0 - 0.4 x10E3/uL   Basophils Absolute 0.1 0.0 - 0.2 x10E3/uL   Immature Granulocytes 1 Not Estab. %   Immature Grans (Abs) 0.1 0.0 - 0.1 x10E3/uL  Comprehensive metabolic panel  Result Value Ref Range   Glucose 108 (H) 65 - 99 mg/dL   BUN 17 6 - 24 mg/dL   Creatinine, Ser 1.34 (H) 0.76 - 1.27 mg/dL   GFR calc non Af Amer 58 (L) >59 mL/min/1.73   GFR calc Af Amer 68 >59 mL/min/1.73   BUN/Creatinine Ratio 13 9 - 20   Sodium 138 134 - 144 mmol/L   Potassium 4.8 3.5 - 5.2 mmol/L   Chloride 101 96 - 106 mmol/L   CO2 22 20 - 29 mmol/L   Calcium 9.9 8.7 - 10.2 mg/dL   Total Protein 7.4 6.0 - 8.5 g/dL   Albumin 4.3 3.8 - 4.9 g/dL   Globulin, Total 3.1 1.5 - 4.5 g/dL   Albumin/Globulin Ratio 1.4 1.2 - 2.2   Bilirubin Total 0.4 0.0 - 1.2 mg/dL   Alkaline Phosphatase 81 39 - 117 IU/L   AST 27 0 - 40 IU/L   ALT 26 0 - 44 IU/L  Lipid Panel w/o Chol/HDL Ratio  Result Value Ref Range   Cholesterol, Total 189 100 - 199 mg/dL   Triglycerides 384 (H) 0 - 149 mg/dL   HDL 42 >39 mg/dL   VLDL Cholesterol Cal 62 (H) 5 - 40 mg/dL   LDL Chol Calc (NIH) 85 0 - 99 mg/dL  Microalbumin, Urine Waived  Result Value Ref Range   Microalb, Ur Waived 10 0 - 19 mg/L   Creatinine, Urine Waived 300 10 - 300 mg/dL   Microalb/Creat Ratio <30 <30 mg/g  PSA  Result Value Ref Range   Prostate Specific Ag, Serum 4.0 0.0 - 4.0 ng/mL  TSH  Result Value Ref Range   TSH 0.726 0.450 - 4.500 uIU/mL  UA/M w/rflx Culture, Routine   Specimen: Urine   URINE  Result Value Ref Range   Specific Gravity, UA 1.020 1.005 - 1.030   pH, UA 5.0 5.0 - 7.5   Color, UA Yellow Yellow   Appearance Ur Clear Clear   Leukocytes,UA Negative Negative   Protein,UA Negative Negative/Trace   Glucose,  UA Negative Negative   Ketones, UA Trace (A) Negative   RBC, UA Negative Negative   Bilirubin, UA Negative Negative   Urobilinogen, Ur 0.2 0.2 - 1.0 mg/dL   Nitrite, UA  Negative Negative      Assessment & Plan:   Problem List Items Addressed This Visit      Cardiovascular and Mediastinum   HTN (hypertension) - Primary    Not under good control. Will increase his lisinopril to 20mg  and stop his HCTZ. Call with any concerns. Continue to monitor.       Relevant Medications   lisinopril (ZESTRIL) 20 MG tablet   Other Relevant Orders   Basic metabolic panel     Nervous and Auditory   Neuropathy    Under good control on current regimen. Continue current regimen. Continue to monitor. Call with any concerns. Refills given.            Follow up plan: Return in about 4 weeks (around 09/12/2019).

## 2019-08-16 LAB — BASIC METABOLIC PANEL
BUN/Creatinine Ratio: 9 (ref 9–20)
BUN: 12 mg/dL (ref 6–24)
CO2: 22 mmol/L (ref 20–29)
Calcium: 9.6 mg/dL (ref 8.7–10.2)
Chloride: 105 mmol/L (ref 96–106)
Creatinine, Ser: 1.32 mg/dL — ABNORMAL HIGH (ref 0.76–1.27)
GFR calc Af Amer: 68 mL/min/{1.73_m2} (ref >59)
GFR calc non Af Amer: 59 mL/min/{1.73_m2} — ABNORMAL LOW (ref >59)
Glucose: 73 mg/dL (ref 65–99)
Potassium: 4.7 mmol/L (ref 3.5–5.2)
Sodium: 141 mmol/L (ref 134–144)

## 2019-08-19 ENCOUNTER — Encounter: Payer: Self-pay | Admitting: Family Medicine

## 2019-09-22 ENCOUNTER — Encounter: Payer: Self-pay | Admitting: Family Medicine

## 2019-09-22 ENCOUNTER — Other Ambulatory Visit: Payer: Self-pay

## 2019-09-22 ENCOUNTER — Ambulatory Visit (INDEPENDENT_AMBULATORY_CARE_PROVIDER_SITE_OTHER): Payer: 59 | Admitting: Family Medicine

## 2019-09-22 VITALS — BP 116/68 | HR 92 | Temp 98.4°F | Ht 66.14 in | Wt 223.2 lb

## 2019-09-22 DIAGNOSIS — I10 Essential (primary) hypertension: Secondary | ICD-10-CM

## 2019-09-22 NOTE — Assessment & Plan Note (Signed)
Under good control on current regimen. Continue current regimen. Continue to monitor. Call with any concerns. Refills given. Labs drawn today.   

## 2019-09-22 NOTE — Progress Notes (Signed)
BP 116/68 (BP Location: Left Arm, Cuff Size: Normal)   Pulse 92   Temp 98.4 F (36.9 C) (Oral)   Ht 5' 6.14" (1.68 m)   Wt 223 lb 3.2 oz (101.2 kg)   SpO2 95%   BMI 35.87 kg/m    Subjective:    Patient ID: Douglas Murillo, male    DOB: 1960/09/01, 59 y.o.   MRN: [id number]  HPI: Douglas Murillo is a 59 y.o. male  Chief Complaint  Patient presents with  . Hypertension   HYPERTENSION Hypertension status: better  Satisfied with current treatment? yes Duration of hypertension: chronic BP monitoring frequency:  not checking BP medication side effects:  no Medication compliance: excellent compliance Previous BP meds:lisinopril Aspirin: no Recurrent headaches: no Visual changes: no Palpitations: no Dyspnea: no Chest pain: no Lower extremity edema: no Dizzy/lightheaded: no   Relevant past medical, surgical, family and social history reviewed and updated as indicated. Interim medical history since our last visit reviewed. Allergies and medications reviewed and updated.  Review of Systems  Constitutional: Negative.   Respiratory: Negative.   Cardiovascular: Negative.   Gastrointestinal: Negative.   Musculoskeletal: Negative.   Psychiatric/Behavioral: Negative.     Per HPI unless specifically indicated above     Objective:    BP 116/68 (BP Location: Left Arm, Cuff Size: Normal)   Pulse 92   Temp 98.4 F (36.9 C) (Oral)   Ht 5' 6.14" (1.68 m)   Wt 223 lb 3.2 oz (101.2 kg)   SpO2 95%   BMI 35.87 kg/m   Wt Readings from Last 3 Encounters:  09/22/19 223 lb 3.2 oz (101.2 kg)  08/09/18 219 lb (99.3 kg)  05/09/18 215 lb (97.5 kg)    Physical Exam Vitals and nursing note reviewed.  Constitutional:      General: He is not in acute distress.    Appearance: Normal appearance. He is not ill-appearing, toxic-appearing or diaphoretic.  HENT:     Head: Normocephalic and atraumatic.     Right Ear: External ear normal.     Left Ear: External ear normal.       Nose: Nose normal.     Mouth/Throat:     Mouth: Mucous membranes are moist.     Pharynx: Oropharynx is clear.  Eyes:     General: No scleral icterus.       Right eye: No discharge.        Left eye: No discharge.     Extraocular Movements: Extraocular movements intact.     Conjunctiva/sclera: Conjunctivae normal.     Pupils: Pupils are equal, round, and reactive to light.  Cardiovascular:     Rate and Rhythm: Normal rate and regular rhythm.     Pulses: Normal pulses.     Heart sounds: Normal heart sounds. No murmur. No friction rub. No gallop.   Pulmonary:     Effort: Pulmonary effort is normal. No respiratory distress.     Breath sounds: Normal breath sounds. No stridor. No wheezing, rhonchi or rales.  Chest:     Chest wall: No tenderness.  Musculoskeletal:        General: Normal range of motion.     Cervical back: Normal range of motion and neck supple.  Skin:    General: Skin is warm and dry.     Capillary Refill: Capillary refill takes less than 2 seconds.     Coloration: Skin is not jaundiced or pale.     Findings: No bruising, erythema, lesion  or rash.  Neurological:     General: No focal deficit present.     Mental Status: He is alert and oriented to person, place, and time. Mental status is at baseline.  Psychiatric:        Mood and Affect: Mood normal.        Behavior: Behavior normal.        Thought Content: Thought content normal.        Judgment: Judgment normal.     Results for orders placed or performed in visit on [id number]  Basic metabolic panel  Result Value Ref Range   Glucose 73 65 - 99 mg/dL   BUN 12 6 - 24 mg/dL   Creatinine, Ser 1.32 (H) 0.76 - 1.27 mg/dL   GFR calc non Af Amer 59 (L) >59 mL/min/1.73   GFR calc Af Amer 68 >59 mL/min/1.73   BUN/Creatinine Ratio 9 9 - 20   Sodium 141 134 - 144 mmol/L   Potassium 4.7 3.5 - 5.2 mmol/L   Chloride 105 96 - 106 mmol/L   CO2 22 20 - 29 mmol/L   Calcium 9.6 8.7 - 10.2 mg/dL      Assessment &  Plan:   Problem List Items Addressed This Visit      Cardiovascular and Mediastinum   HTN (hypertension) - Primary    Under good control on current regimen. Continue current regimen. Continue to monitor. Call with any concerns. Refills given. Labs drawn today.       Relevant Orders   Basic metabolic panel       Follow up plan: Return in about 6 months (around 03/23/2020).

## 2019-09-23 LAB — BASIC METABOLIC PANEL
BUN/Creatinine Ratio: 9 (ref 9–20)
BUN: 13 mg/dL (ref 6–24)
CO2: 26 mmol/L (ref 20–29)
Calcium: 9.7 mg/dL (ref 8.7–10.2)
Chloride: 104 mmol/L (ref 96–106)
Creatinine, Ser: 1.49 mg/dL — ABNORMAL HIGH (ref 0.76–1.27)
GFR calc Af Amer: 59 mL/min/{1.73_m2} — ABNORMAL LOW (ref >59)
GFR calc non Af Amer: 51 mL/min/{1.73_m2} — ABNORMAL LOW (ref >59)
Glucose: 106 mg/dL — ABNORMAL HIGH (ref 65–99)
Potassium: 4.5 mmol/L (ref 3.5–5.2)
Sodium: 143 mmol/L (ref 134–144)

## 2020-02-01 ENCOUNTER — Other Ambulatory Visit: Payer: Self-pay | Admitting: Family Medicine

## 2020-02-17 ENCOUNTER — Other Ambulatory Visit: Payer: Self-pay | Admitting: Family Medicine

## 2020-02-17 NOTE — Telephone Encounter (Signed)
Requested Prescriptions  Pending Prescriptions Disp Refills  . lisinopril (ZESTRIL) 20 MG tablet [Pharmacy Med Name: LISINOPRIL 20MG  TABLETS] 90 tablet 0    Sig: TAKE 1 TABLET(20 MG) BY MOUTH DAILY     Cardiovascular:  ACE Inhibitors Failed - 02/17/2020  3:49 PM      Failed - Cr in normal range and within 180 days    Creatinine, Ser  Date Value Ref Range Status  09/22/2019 1.49 (H) 0.76 - 1.27 mg/dL Final         Passed - K in normal range and within 180 days    Potassium  Date Value Ref Range Status  09/22/2019 4.5 3.5 - 5.2 mmol/L Final         Passed - Patient is not pregnant      Passed - Last BP in normal range    BP Readings from Last 1 Encounters:  09/22/19 116/68         Passed - Valid encounter within last 6 months    Recent Outpatient Visits          4 months ago Essential hypertension   [place], Megan P, DO   6 months ago Essential hypertension   [place], Megan P, DO   1 year ago Routine general medical examination at a health care facility   [place], [place], DO   1 year ago Essential hypertension   [place], [place], DO   1 year ago Essential hypertension   [place], [place], DO      Future Appointments            In 1 month Johnson, Barb Merino, DO [facility]

## 2020-03-29 ENCOUNTER — Other Ambulatory Visit: Payer: Self-pay

## 2020-03-29 ENCOUNTER — Encounter: Payer: Self-pay | Admitting: Family Medicine

## 2020-03-29 ENCOUNTER — Ambulatory Visit (INDEPENDENT_AMBULATORY_CARE_PROVIDER_SITE_OTHER): Payer: 59 | Admitting: Family Medicine

## 2020-03-29 VITALS — BP 110/64 | HR 79 | Temp 99.2°F | Ht 68.0 in | Wt 230.0 lb

## 2020-03-29 DIAGNOSIS — I10 Essential (primary) hypertension: Secondary | ICD-10-CM | POA: Diagnosis not present

## 2020-03-29 MED ORDER — NORTRIPTYLINE HCL 25 MG PO CAPS: ORAL_CAPSULE | ORAL | 3 refills | Status: DC

## 2020-03-29 MED ORDER — LISINOPRIL 20 MG PO TABS: ORAL_TABLET | ORAL | 1 refills | Status: DC

## 2020-03-29 NOTE — Progress Notes (Signed)
BP 110/64 (BP Location: Right Arm, Patient Position: Sitting)   Pulse 79   Temp 99.2 F (37.3 C) (Oral)   Ht 5\' 8"  (1.727 m)   Wt 230 lb (104.3 kg)   SpO2 94%   BMI 34.97 kg/m    Subjective:    Patient ID: Douglas Murillo, male    DOB: [birth year], 59 y.o.   MRN: [id number]  HPI: Douglas Murillo is a 59 y.o. male  Chief Complaint  Patient presents with  . Hypertension    follow up    HYPERTENSION Hypertension status: controlled  Satisfied with current treatment? yes Duration of hypertension: chronic BP monitoring frequency:  not checking BP medication side effects:  no Medication compliance: excellent compliance Previous BP meds:lisinopril Aspirin: no Recurrent headaches: no Visual changes: no Palpitations: no Dyspnea: no Chest pain: no Lower extremity edema: no Dizzy/lightheaded: no  Relevant past medical, surgical, family and social history reviewed and updated as indicated. Interim medical history since our last visit reviewed. Allergies and medications reviewed and updated.  Review of Systems  Constitutional: Negative.   Respiratory: Negative.   Cardiovascular: Negative.   Gastrointestinal: Negative.   Musculoskeletal: Negative.   Neurological: Negative.   Psychiatric/Behavioral: Negative.     Per HPI unless specifically indicated above     Objective:    BP 110/64 (BP Location: Right Arm, Patient Position: Sitting)   Pulse 79   Temp 99.2 F (37.3 C) (Oral)   Ht 5\' 8"  (1.727 m)   Wt 230 lb (104.3 kg)   SpO2 94%   BMI 34.97 kg/m   Wt Readings from Last 3 Encounters:  03/29/20 230 lb (104.3 kg)  09/22/19 223 lb 3.2 oz (101.2 kg)  08/09/18 219 lb (99.3 kg)    Physical Exam Vitals and nursing note reviewed.  Constitutional:      General: He is not in acute distress.    Appearance: Normal appearance. He is not ill-appearing, toxic-appearing or diaphoretic.  HENT:     Head: Normocephalic and atraumatic.     Right Ear: External  ear normal.     Left Ear: External ear normal.     Nose: Nose normal.     Mouth/Throat:     Mouth: Mucous membranes are moist.     Pharynx: Oropharynx is clear.  Eyes:     General: No scleral icterus.       Right eye: No discharge.        Left eye: No discharge.     Extraocular Movements: Extraocular movements intact.     Conjunctiva/sclera: Conjunctivae normal.     Pupils: Pupils are equal, round, and reactive to light.  Cardiovascular:     Rate and Rhythm: Normal rate and regular rhythm.     Pulses: Normal pulses.     Heart sounds: Normal heart sounds. No murmur heard.  No friction rub. No gallop.   Pulmonary:     Effort: Pulmonary effort is normal. No respiratory distress.     Breath sounds: Normal breath sounds. No stridor. No wheezing, rhonchi or rales.  Chest:     Chest wall: No tenderness.  Musculoskeletal:        General: Normal range of motion.     Cervical back: Normal range of motion and neck supple.  Skin:    General: Skin is warm and dry.     Capillary Refill: Capillary refill takes less than 2 seconds.     Coloration: Skin is not jaundiced or pale.  Findings: No bruising, erythema, lesion or rash.  Neurological:     General: No focal deficit present.     Mental Status: He is alert and oriented to person, place, and time. Mental status is at baseline.  Psychiatric:        Mood and Affect: Mood normal.        Behavior: Behavior normal.        Thought Content: Thought content normal.        Judgment: Judgment normal.     Results for orders placed or performed in visit on 60/67/70  Basic metabolic panel  Result Value Ref Range   Glucose 106 (H) 65 - 99 mg/dL   BUN 13 6 - 24 mg/dL   Creatinine, Ser 1.49 (H) 0.76 - 1.27 mg/dL   GFR calc non Af Amer 51 (L) >59 mL/min/1.73   GFR calc Af Amer 59 (L) >59 mL/min/1.73   BUN/Creatinine Ratio 9 9 - 20   Sodium 143 134 - 144 mmol/L   Potassium 4.5 3.5 - 5.2 mmol/L   Chloride 104 96 - 106 mmol/L   CO2 26 20 - 29  mmol/L   Calcium 9.7 8.7 - 10.2 mg/dL      Assessment & Plan:   Problem List Items Addressed This Visit      Cardiovascular and Mediastinum   HTN (hypertension) - Primary    Under good control on current regimen. Continue current regimen. Continue to monitor. Call with any concerns. Refills given. Labs drawn today.       Relevant Medications   lisinopril (ZESTRIL) 20 MG tablet   Other Relevant Orders   Basic metabolic panel       Follow up plan: Return in about 6 months (around 09/27/2020) for physical.

## 2020-03-29 NOTE — Assessment & Plan Note (Signed)
Under good control on current regimen. Continue current regimen. Continue to monitor. Call with any concerns. Refills given. Labs drawn today.   

## 2020-03-30 ENCOUNTER — Encounter: Payer: Self-pay | Admitting: Family Medicine

## 2020-03-30 LAB — BASIC METABOLIC PANEL
BUN/Creatinine Ratio: 14 (ref 9–20)
BUN: 20 mg/dL (ref 6–24)
CO2: 23 mmol/L (ref 20–29)
Calcium: 9.4 mg/dL (ref 8.7–10.2)
Chloride: 100 mmol/L (ref 96–106)
Creatinine, Ser: 1.45 mg/dL — ABNORMAL HIGH (ref 0.76–1.27)
GFR calc Af Amer: 61 mL/min/{1.73_m2} (ref >59)
GFR calc non Af Amer: 53 mL/min/{1.73_m2} — ABNORMAL LOW (ref >59)
Glucose: 80 mg/dL (ref 65–99)
Potassium: 4.7 mmol/L (ref 3.5–5.2)
Sodium: 136 mmol/L (ref 134–144)

## 2020-05-18 ENCOUNTER — Telehealth: Payer: Self-pay

## 2020-05-18 NOTE — Telephone Encounter (Signed)
Pt is sch with Douglas Murillo on 05-20-2020 for virtually appt

## 2020-05-18 NOTE — Telephone Encounter (Signed)
Called pt's wife sherry no answer left vm. Was going to schedule Thursday virtually no availability today  Copied from [place] [id number]. Topic: General - Other >> May 18, 2020  8:21 AM Celene Kras wrote: Reason for CRM: Pts wife called stating that the pt has an ear ache and sinus issues. She is requesting to have pt seen virtually today. Please advise.

## 2020-05-20 ENCOUNTER — Other Ambulatory Visit: Payer: Self-pay

## 2020-05-20 ENCOUNTER — Encounter: Payer: Self-pay | Admitting: Unknown Physician Specialty

## 2020-05-20 ENCOUNTER — Ambulatory Visit (INDEPENDENT_AMBULATORY_CARE_PROVIDER_SITE_OTHER): Payer: 59 | Admitting: Unknown Physician Specialty

## 2020-05-20 DIAGNOSIS — R059 Cough, unspecified: Secondary | ICD-10-CM

## 2020-05-20 DIAGNOSIS — J069 Acute upper respiratory infection, unspecified: Secondary | ICD-10-CM

## 2020-05-20 MED ORDER — AZITHROMYCIN 250 MG PO TABS: ORAL_TABLET | ORAL | 0 refills | Status: DC

## 2020-05-20 MED ORDER — HYDROCOD POLST-CPM POLST ER 10-8 MG/5ML PO SUER: 5.0000 mL | Freq: Two times a day (BID) | ORAL | 0 refills | Status: DC | PRN

## 2020-05-20 NOTE — Progress Notes (Signed)
There were no vitals taken for this visit.   Subjective:    Patient ID: Douglas Murillo, male    DOB: 1961/03/25, 59 y.o.   MRN: [id number]  HPI: Douglas Murillo is a 59 y.o. male  Chief Complaint  Patient presents with  . Abdominal Pain    Stomach hurts from cough so much  . Ear Pain    All started on Saturday Dec 11.   . Neck Pain  . Chest congestion  . Sinuse pressure  . Head congestion  . Cough    Cough more often at night when he goes to bed.  . Fatigue    Has only been able to sleep a few hours a night due to coughing   This visit was completed via telephone due to the restrictions of the COVID-19 pandemic. All issues as above were discussed and addressed but no physical exam was performed. If it was felt that the patient should be evaluated in the office, they were directed there. The patient verbally consented to this visit. Patient was unable to complete an audio/visual visit due to Technical difficulties,Lack of internet. . Location of the patient: home . Location of the provider: work . Those involved with this call:  . Provider: Kathrine Haddock, DNP . CMA: Yvonna Alanis, CMA . Front Desk/Registration: Jill Side  . Time spent on call: 10 minutes on the phone discussing health concerns. 10 minutes total spent in review of patient's record and preparation of their chart.  I verified patient identity using two factors (patient name and date of birth). Patient consents verbally to being seen via telemedicine visit today.   Cough This is a new problem. Episode onset: 5 days ago. The problem has been waxing and waning. The cough is productive of sputum. Associated symptoms include chest pain, ear congestion, nasal congestion, postnasal drip and rhinorrhea. Pertinent negatives include no chills, ear pain, fever, headaches, heartburn, hemoptysis, myalgias, rash, sore throat, shortness of breath, sweats, weight loss or wheezing. The symptoms are aggravated by  lying down. He has tried OTC cough suppressant for the symptoms. The treatment provided no relief.   Pt has not received covid vaccinations.  He refuses to get covid testing.    Relevant past medical, surgical, family and social history reviewed and updated as indicated. Interim medical history since our last visit reviewed. Allergies and medications reviewed and updated.  Review of Systems  Constitutional: Negative for chills, fever and weight loss.  HENT: Positive for postnasal drip and rhinorrhea. Negative for ear pain and sore throat.   Respiratory: Positive for cough. Negative for hemoptysis, shortness of breath and wheezing.   Cardiovascular: Positive for chest pain.  Gastrointestinal: Negative for heartburn.  Musculoskeletal: Negative for myalgias.  Skin: Negative for rash.  Neurological: Negative for headaches.    Per HPI unless specifically indicated above     Objective:    There were no vitals taken for this visit.  Wt Readings from Last 3 Encounters:  03/29/20 230 lb (104.3 kg)  09/22/19 223 lb 3.2 oz (101.2 kg)  08/09/18 219 lb (99.3 kg)    Physical Exam Skin:    General: Skin is warm and dry.  Neurological:     Mental Status: He is alert.        Assessment & Plan:   Problem List Items Addressed This Visit   None   Visit Diagnoses    Cough    -  Primary   Pt with cough.  Refusing  Covid testing.  Agrees to quarantine.  Will rx cough medication.  Z pack to r/o atypical   Viral upper respiratory tract infection       supportive care.  Recommended pulse ox to monitor oxygen.     Relevant Medications   azithromycin (ZITHROMAX Z-PAK) 250 MG tablet      Discussed to quarantine as if he has Covid.  Discussed the possibility of mab if tests positive.  Pt is still refusing.  Allergic to prednisone.  To the ER if oxygen levels drop lower than 90%  Follow up plan: Return if symptoms worsen or fail to improve.

## 2020-07-30 ENCOUNTER — Other Ambulatory Visit: Payer: Self-pay | Admitting: Family Medicine

## 2020-09-27 ENCOUNTER — Encounter: Payer: Self-pay | Admitting: Family Medicine

## 2020-09-27 ENCOUNTER — Ambulatory Visit (INDEPENDENT_AMBULATORY_CARE_PROVIDER_SITE_OTHER): Payer: 59 | Admitting: Family Medicine

## 2020-09-27 ENCOUNTER — Other Ambulatory Visit: Payer: Self-pay

## 2020-09-27 VITALS — BP 119/73 | HR 98 | Temp 98.5°F | Ht 67.2 in | Wt 229.8 lb

## 2020-09-27 DIAGNOSIS — Z0000 Encounter for general adult medical examination without abnormal findings: Secondary | ICD-10-CM

## 2020-09-27 DIAGNOSIS — I10 Essential (primary) hypertension: Secondary | ICD-10-CM

## 2020-09-27 DIAGNOSIS — G629 Polyneuropathy, unspecified: Secondary | ICD-10-CM

## 2020-09-27 DIAGNOSIS — Z23 Encounter for immunization: Secondary | ICD-10-CM

## 2020-09-27 LAB — MICROALBUMIN, URINE WAIVED
Creatinine, Urine Waived: 300 mg/dL (ref 10–300)
Microalb, Ur Waived: 10 mg/L (ref 0–19)
Microalb/Creat Ratio: <30 mg/g (ref <30)

## 2020-09-27 LAB — URINALYSIS, ROUTINE W REFLEX MICROSCOPIC
Bilirubin, UA: NEGATIVE
Glucose, UA: NEGATIVE
Ketones, UA: NEGATIVE
Leukocytes,UA: NEGATIVE
Nitrite, UA: NEGATIVE
Protein,UA: NEGATIVE
RBC, UA: NEGATIVE
Specific Gravity, UA: 1.025 (ref 1.005–1.030)
Urobilinogen, Ur: 0.2 mg/dL (ref 0.2–1.0)
pH, UA: 6 (ref 5.0–7.5)

## 2020-09-27 MED ORDER — LISINOPRIL 20 MG PO TABS: ORAL_TABLET | ORAL | 1 refills | Status: DC

## 2020-09-27 MED ORDER — NORTRIPTYLINE HCL 25 MG PO CAPS: 25.0000 mg | ORAL_CAPSULE | Freq: Every day | ORAL | 1 refills | Status: DC

## 2020-09-27 NOTE — Progress Notes (Signed)
BP 119/73   Pulse 98   Temp 98.5 F (36.9 C) (Oral)   Ht 5' 7.2" (1.707 m)   Wt 229 lb 12.8 oz (104.2 kg)   SpO2 97%   BMI 35.78 kg/m    Subjective:    Patient ID: Douglas Murillo, male    DOB: 1961-01-05, 60 y.o.   MRN: [id number]  HPI: Douglas Murillo is a 60 y.o. male presenting on 09/27/2020 for comprehensive medical examination. Current medical complaints include:  HYPERTENSION Hypertension status: controlled  Satisfied with current treatment? yes Duration of hypertension: chronic BP monitoring frequency:  not checking BP range:  BP medication side effects:  no Medication compliance: excellent compliance Previous BP meds: lisinopril Aspirin: no Recurrent headaches: no Visual changes: no Palpitations: no Dyspnea: no Chest pain: no Lower extremity edema: no Dizzy/lightheaded: no  Interim Problems from his last visit: no  Depression Screen done today and results listed below:  Depression screen [facility] 2/9 09/27/2020 03/29/2020 02/14/2019 10/30/2017 08/01/2016  Decreased Interest 0 0 0 0 0  Down, Depressed, Hopeless 0 0 0 0 0  PHQ - 2 Score 0 0 0 0 0  Altered sleeping - 0 - 1 -  Tired, decreased energy - 0 - 1 -  Change in appetite - 0 - 0 -  Feeling bad or failure about yourself  - 0 - 0 -  Trouble concentrating - 0 - 0 -  Moving slowly or fidgety/restless - 0 - 0 -  Suicidal thoughts - 0 - 0 -  PHQ-9 Score - 0 - 2 -  Difficult doing work/chores - Not difficult at all - Not difficult at all -    Past Medical History:  Past Medical History:  Diagnosis Date  . GERD (gastroesophageal reflux disease)   . Hypertension     Surgical History:  Past Surgical History:  Procedure Laterality Date  . ANAL FISSURE REPAIR    . CHOLECYSTECTOMY      Medications:  Current Outpatient Medications on File Prior to Visit  Medication Sig  . Acetaminophen (TYLENOL 8 HOUR PO) Take by mouth as needed.  Marland Kitchen esomeprazole (NEXIUM) 20 MG capsule Take 20 mg by mouth daily  at 12 noon.  . Multiple Vitamins-Minerals (MULTIVITAMIN PO) Take by mouth daily.   No current facility-administered medications on file prior to visit.    Allergies:  Allergies  Allergen Reactions  . Prednisone Other (See Comments)    Joint Pain  . Hctz [Hydrochlorothiazide] Other (See Comments)    Headache    Social History:  Social History   Socioeconomic History  . Marital status: Married    Spouse name: Not on file  . Number of children: Not on file  . Years of education: Not on file  . Highest education level: Not on file  Occupational History  . Not on file  Tobacco Use  . Smoking status: Former Smoker    Types: Cigarettes    Quit date: 12/20/2015    Years since quitting: 4.7  . Smokeless tobacco: Never Used  Vaping Use  . Vaping Use: Former  Substance and Sexual Activity  . Alcohol use: No  . Drug use: Yes    Types: Marijuana  . Sexual activity: Yes  Other Topics Concern  . Not on file  Social History Narrative  . Not on file   Social Determinants of Health   Financial Resource Strain: Not on file  Food Insecurity: Not on file  Transportation Needs: Not on file  Physical Activity: Not on file  Stress: Not on file  Social Connections: Not on file  Intimate Partner Violence: Not on file   Social History   Tobacco Use  Smoking Status Former Smoker  . Types: Cigarettes  . Quit date: 12/20/2015  . Years since quitting: 4.7  Smokeless Tobacco Never Used   Social History   Substance and Sexual Activity  Alcohol Use No    Family History:  Family History  Problem Relation Age of Onset  . Arthritis Mother   . Cancer Mother        breast  . Cancer Father        lymphoma  . Diabetes Father   . Hearing loss Father   . Heart disease Father   . Hypertension Father   . Kidney disease Father   . Stroke Maternal Grandmother   . Arthritis Maternal Grandmother   . Birth defects Maternal Grandfather   . Arthritis Paternal Grandmother   . Heart  disease Paternal Grandfather   . Graves' disease Daughter   . Fibromyalgia Sister   . Heart disease Brother   . Cancer Brother     Past medical history, surgical history, medications, allergies, family history and social history reviewed with patient today and changes made to appropriate areas of the chart.   Review of Systems  Constitutional: Negative.   HENT: Negative.   Eyes: Positive for blurred vision. Negative for double vision, photophobia, pain, discharge and redness.  Respiratory: Negative.   Cardiovascular: Negative.   Gastrointestinal: Negative.   Genitourinary: Negative.   Musculoskeletal: Negative.   Skin: Negative.   Neurological: Negative.   Endo/Heme/Allergies: Negative.   Psychiatric/Behavioral: Negative.    All other ROS negative except what is listed above and in the HPI.      Objective:    BP 119/73   Pulse 98   Temp 98.5 F (36.9 C) (Oral)   Ht 5' 7.2" (1.707 m)   Wt 229 lb 12.8 oz (104.2 kg)   SpO2 97%   BMI 35.78 kg/m   Wt Readings from Last 3 Encounters:  09/27/20 229 lb 12.8 oz (104.2 kg)  03/29/20 230 lb (104.3 kg)  09/22/19 223 lb 3.2 oz (101.2 kg)    Physical Exam Vitals and nursing note reviewed.  Constitutional:      General: He is not in acute distress.    Appearance: Normal appearance. He is obese. He is not ill-appearing, toxic-appearing or diaphoretic.  HENT:     Head: Normocephalic and atraumatic.     Right Ear: Tympanic membrane, ear canal and external ear normal. There is no impacted cerumen.     Left Ear: Tympanic membrane, ear canal and external ear normal. There is no impacted cerumen.     Nose: Nose normal. No congestion or rhinorrhea.     Mouth/Throat:     Mouth: Mucous membranes are moist.     Pharynx: Oropharynx is clear. No oropharyngeal exudate or posterior oropharyngeal erythema.  Eyes:     General: No scleral icterus.       Right eye: No discharge.        Left eye: No discharge.     Extraocular Movements:  Extraocular movements intact.     Conjunctiva/sclera: Conjunctivae normal.     Pupils: Pupils are equal, round, and reactive to light.  Neck:     Vascular: No carotid bruit.  Cardiovascular:     Rate and Rhythm: Normal rate and regular rhythm.     Pulses: Normal  pulses.     Heart sounds: No murmur heard. No friction rub. No gallop.   Pulmonary:     Effort: Pulmonary effort is normal. No respiratory distress.     Breath sounds: Normal breath sounds. No stridor. No wheezing, rhonchi or rales.  Chest:     Chest wall: No tenderness.  Abdominal:     General: Abdomen is flat. Bowel sounds are normal. There is no distension.     Palpations: Abdomen is soft. There is no mass.     Tenderness: There is no abdominal tenderness. There is no right CVA tenderness, left CVA tenderness, guarding or rebound.     Hernia: No hernia is present.  Genitourinary:    Comments: Genital exam deferred with shared decision making Musculoskeletal:        General: No swelling, tenderness, deformity or signs of injury.     Cervical back: Normal range of motion and neck supple. No rigidity. No muscular tenderness.     Right lower leg: No edema.     Left lower leg: No edema.  Lymphadenopathy:     Cervical: No cervical adenopathy.  Skin:    General: Skin is warm and dry.     Capillary Refill: Capillary refill takes less than 2 seconds.     Coloration: Skin is not jaundiced or pale.     Findings: No bruising, erythema, lesion or rash.  Neurological:     General: No focal deficit present.     Mental Status: He is alert and oriented to person, place, and time.     Cranial Nerves: No cranial nerve deficit.     Sensory: No sensory deficit.     Motor: No weakness.     Coordination: Coordination normal.     Gait: Gait normal.     Deep Tendon Reflexes: Reflexes normal.  Psychiatric:        Mood and Affect: Mood normal.        Behavior: Behavior normal.        Thought Content: Thought content normal.         Judgment: Judgment normal.     Results for orders placed or performed in visit on [id number]  Basic metabolic panel  Result Value Ref Range   Glucose 80 65 - 99 mg/dL   BUN 20 6 - 24 mg/dL   Creatinine, Ser 1.45 (H) 0.76 - 1.27 mg/dL   GFR calc non Af Amer 53 (L) >59 mL/min/1.73   GFR calc Af Amer 61 >59 mL/min/1.73   BUN/Creatinine Ratio 14 9 - 20   Sodium 136 134 - 144 mmol/L   Potassium 4.7 3.5 - 5.2 mmol/L   Chloride 100 96 - 106 mmol/L   CO2 23 20 - 29 mmol/L   Calcium 9.4 8.7 - 10.2 mg/dL      Assessment & Plan:   Problem List Items Addressed This Visit      Cardiovascular and Mediastinum   HTN (hypertension)    Under good control on current regimen. Continue current regimen. Continue to monitor. Call with any concerns. Refills given. Labs drawn today.       Relevant Medications   lisinopril (ZESTRIL) 20 MG tablet   Other Relevant Orders   Microalbumin, Urine Waived     Nervous and Auditory   Neuropathy    Under good control on current regimen. Continue current regimen. Continue to monitor. Call with any concerns. Refills given.         Other Visit Diagnoses  Routine general medical examination at a health care facility    -  Primary   Vaccines up to date/declined. Screening labs checked today. Colonoscopy decelined. Continue diet and exercise. Call with any concerns. Continue to monitor.    Relevant Orders   Comprehensive metabolic panel   CBC with Differential/Platelet   Lipid Panel w/o Chol/HDL Ratio   PSA   TSH   Urinalysis, Routine w reflex microscopic   Microalbumin, Urine Waived      LABORATORY TESTING:  Health maintenance labs ordered today as discussed above.   The natural history of prostate cancer and ongoing controversy regarding screening and potential treatment outcomes of prostate cancer has been discussed with the patient. The meaning of a false positive PSA and a false negative PSA has been discussed. He indicates understanding of the  limitations of this screening test and wishes to proceed with screening PSA testing.   IMMUNIZATIONS:   - Tdap: Tetanus vaccination status reviewed: Tdap vaccination indicated and given today. - Influenza: Postponed to flu season - Pneumovax: Not applicable - Prevnar: Not applicable - COVID: Refused  SCREENING: - Colonoscopy: Refused  Discussed with patient purpose of the colonoscopy is to detect colon cancer at curable precancerous or early stages   PATIENT COUNSELING:    Sexuality: Discussed sexually transmitted diseases, partner selection, use of condoms, avoidance of unintended pregnancy  and contraceptive alternatives.   Advised to avoid cigarette smoking.  I discussed with the patient that most people either abstain from alcohol or drink within safe limits (<=14/week and <=4 drinks/occasion for males, <=7/weeks and <= 3 drinks/occasion for females) and that the risk for alcohol disorders and other health effects rises proportionally with the number of drinks per week and how often a drinker exceeds daily limits.  Discussed cessation/primary prevention of drug use and availability of treatment for abuse.   Diet: Encouraged to adjust caloric intake to maintain  or achieve ideal body weight, to reduce intake of dietary saturated fat and total fat, to limit sodium intake by avoiding high sodium foods and not adding table salt, and to maintain adequate dietary potassium and calcium preferably from fresh fruits, vegetables, and low-fat dairy products.    stressed the importance of regular exercise  Injury prevention: Discussed safety belts, safety helmets, smoke detector, smoking near bedding or upholstery.   Dental health: Discussed importance of regular tooth brushing, flossing, and dental visits.   Follow up plan: NEXT PREVENTATIVE PHYSICAL DUE IN 1 YEAR. Return in about 6 months (around 03/29/2021).

## 2020-09-27 NOTE — Assessment & Plan Note (Signed)
Under good control on current regimen. Continue current regimen. Continue to monitor. Call with any concerns. Refills given. Labs drawn today.   

## 2020-09-27 NOTE — Assessment & Plan Note (Signed)
Under good control on current regimen. Continue current regimen. Continue to monitor. Call with any concerns. Refills given.   

## 2020-09-27 NOTE — Patient Instructions (Signed)

## 2020-09-28 LAB — CBC WITH DIFFERENTIAL/PLATELET
Basophils Absolute: 0.1 10*3/uL (ref 0.0–0.2)
Basos: 1 %
EOS (ABSOLUTE): 0.3 10*3/uL (ref 0.0–0.4)
Eos: 2 %
Hematocrit: 48.2 % (ref 37.5–51.0)
Hemoglobin: 16.2 g/dL (ref 13.0–17.7)
Immature Grans (Abs): 0.1 10*3/uL (ref 0.0–0.1)
Immature Granulocytes: 1 %
Lymphocytes Absolute: 3.3 10*3/uL — ABNORMAL HIGH (ref 0.7–3.1)
Lymphs: 24 %
MCH: 31.5 pg (ref 26.6–33.0)
MCHC: 33.6 g/dL (ref 31.5–35.7)
MCV: 94 fL (ref 79–97)
Monocytes Absolute: 1.2 10*3/uL — ABNORMAL HIGH (ref 0.1–0.9)
Monocytes: 9 %
Neutrophils Absolute: 8.9 10*3/uL — ABNORMAL HIGH (ref 1.4–7.0)
Neutrophils: 63 %
Platelets: 299 10*3/uL (ref 150–450)
RBC: 5.14 x10E6/uL (ref 4.14–5.80)
RDW: 13.1 % (ref 11.6–15.4)
WBC: 13.9 10*3/uL — ABNORMAL HIGH (ref 3.4–10.8)

## 2020-09-28 LAB — COMPREHENSIVE METABOLIC PANEL
ALT: 32 IU/L (ref 0–44)
AST: 27 IU/L (ref 0–40)
Albumin/Globulin Ratio: 1.6 (ref 1.2–2.2)
Albumin: 4.6 g/dL (ref 3.8–4.9)
Alkaline Phosphatase: 90 IU/L (ref 44–121)
BUN/Creatinine Ratio: 11 (ref 9–20)
BUN: 18 mg/dL (ref 6–24)
Bilirubin Total: 0.5 mg/dL (ref 0.0–1.2)
CO2: 22 mmol/L (ref 20–29)
Calcium: 9.8 mg/dL (ref 8.7–10.2)
Chloride: 99 mmol/L (ref 96–106)
Creatinine, Ser: 1.67 mg/dL — ABNORMAL HIGH (ref 0.76–1.27)
Globulin, Total: 2.8 g/dL (ref 1.5–4.5)
Glucose: 88 mg/dL (ref 65–99)
Potassium: 4.9 mmol/L (ref 3.5–5.2)
Sodium: 136 mmol/L (ref 134–144)
Total Protein: 7.4 g/dL (ref 6.0–8.5)
eGFR: 47 mL/min/{1.73_m2} — ABNORMAL LOW (ref >59)

## 2020-09-28 LAB — PSA: Prostate Specific Ag, Serum: 3.9 ng/mL (ref 0.0–4.0)

## 2020-09-28 LAB — LIPID PANEL W/O CHOL/HDL RATIO
Cholesterol, Total: 214 mg/dL — ABNORMAL HIGH (ref 100–199)
HDL: 50 mg/dL (ref >39)
LDL Chol Calc (NIH): 123 mg/dL — ABNORMAL HIGH (ref 0–99)
Triglycerides: 232 mg/dL — ABNORMAL HIGH (ref 0–149)
VLDL Cholesterol Cal: 41 mg/dL — ABNORMAL HIGH (ref 5–40)

## 2020-09-28 LAB — TSH: TSH: 0.957 u[IU]/mL (ref 0.450–4.500)

## 2020-10-01 ENCOUNTER — Other Ambulatory Visit: Payer: Self-pay | Admitting: Family Medicine

## 2020-10-01 DIAGNOSIS — N289 Disorder of kidney and ureter, unspecified: Secondary | ICD-10-CM

## 2020-10-15 ENCOUNTER — Other Ambulatory Visit: Payer: Self-pay

## 2020-10-15 ENCOUNTER — Other Ambulatory Visit: Payer: 59

## 2020-10-15 DIAGNOSIS — N289 Disorder of kidney and ureter, unspecified: Secondary | ICD-10-CM

## 2020-10-16 LAB — BASIC METABOLIC PANEL
BUN/Creatinine Ratio: 13 (ref 9–20)
BUN: 18 mg/dL (ref 6–24)
CO2: 20 mmol/L (ref 20–29)
Calcium: 9.2 mg/dL (ref 8.7–10.2)
Chloride: 100 mmol/L (ref 96–106)
Creatinine, Ser: 1.42 mg/dL — ABNORMAL HIGH (ref 0.76–1.27)
Glucose: 131 mg/dL — ABNORMAL HIGH (ref 65–99)
Potassium: 4.7 mmol/L (ref 3.5–5.2)
Sodium: 138 mmol/L (ref 134–144)
eGFR: 57 mL/min/{1.73_m2} — ABNORMAL LOW (ref >59)

## 2020-10-17 ENCOUNTER — Encounter: Payer: Self-pay | Admitting: Family Medicine

## 2021-04-04 ENCOUNTER — Encounter: Payer: Self-pay | Admitting: Family Medicine

## 2021-04-04 ENCOUNTER — Ambulatory Visit (INDEPENDENT_AMBULATORY_CARE_PROVIDER_SITE_OTHER): Payer: 59 | Admitting: Family Medicine

## 2021-04-04 ENCOUNTER — Other Ambulatory Visit: Payer: Self-pay

## 2021-04-04 VITALS — BP 130/62 | HR 79 | Temp 98.2°F | Wt 222.8 lb

## 2021-04-04 DIAGNOSIS — G629 Polyneuropathy, unspecified: Secondary | ICD-10-CM

## 2021-04-04 DIAGNOSIS — I10 Essential (primary) hypertension: Secondary | ICD-10-CM

## 2021-04-04 DIAGNOSIS — R519 Headache, unspecified: Secondary | ICD-10-CM

## 2021-04-04 MED ORDER — NORTRIPTYLINE HCL 25 MG PO CAPS: 25.0000 mg | ORAL_CAPSULE | Freq: Every day | ORAL | 1 refills | Status: DC

## 2021-04-04 MED ORDER — LISINOPRIL 20 MG PO TABS: ORAL_TABLET | ORAL | 1 refills | Status: DC

## 2021-04-04 NOTE — Assessment & Plan Note (Signed)
Under good control on current regimen. Continue current regimen. Continue to monitor. Call with any concerns. Refills given.   

## 2021-04-04 NOTE — Progress Notes (Signed)
BP 130/62   Pulse 79   Temp 98.2 F (36.8 C)   Wt 222 lb 12.8 oz (101.1 kg)   SpO2 95%   BMI 34.69 kg/m    Subjective:    Patient ID: Douglas Murillo, male    DOB: 16-Feb-1961, 60 y.o.   MRN: [id number]  HPI: Dorin Stooksbury is a 60 y.o. male  Chief Complaint  Patient presents with   Hypertension   Has been having headaches for the past couple of weeks. Seems to be having issues with this around the past couple of years in the fall around the same time.   HYPERTENSION Hypertension status: stable  Satisfied with current treatment? yes Duration of hypertension: chronic BP monitoring frequency:  not checking BP medication side effects:  no Medication compliance: excellent compliance Previous BP meds:lisinopril Aspirin: no Recurrent headaches: yes Visual changes: no Palpitations: no Dyspnea: no Chest pain: no Lower extremity edema: no Dizzy/lightheaded: no  NEUROPATHY Neuropathy status: stable  Satisfied with current treatment?: yes Medication side effects: no Medication compliance:  excellent compliance Location: feet bilaterally Pain: no Severity: moderate  Quality:  tingling and numb Frequency: constant Bilateral: yes Symmetric: yes Numbness: yes Decreased sensation: yes Weakness: no Context: stable   Relevant past medical, surgical, family and social history reviewed and updated as indicated. Interim medical history since our last visit reviewed. Allergies and medications reviewed and updated.  Review of Systems  Constitutional: Negative.   Respiratory: Negative.    Cardiovascular: Negative.   Gastrointestinal: Negative.   Musculoskeletal: Negative.   Neurological:  Positive for headaches. Negative for dizziness, tremors, seizures, syncope, facial asymmetry, speech difficulty, weakness, light-headedness and numbness.  Psychiatric/Behavioral: Negative.     Per HPI unless specifically indicated above     Objective:    BP 130/62    Pulse 79   Temp 98.2 F (36.8 C)   Wt 222 lb 12.8 oz (101.1 kg)   SpO2 95%   BMI 34.69 kg/m   Wt Readings from Last 3 Encounters:  04/04/21 222 lb 12.8 oz (101.1 kg)  09/27/20 229 lb 12.8 oz (104.2 kg)  03/29/20 230 lb (104.3 kg)    Physical Exam Vitals and nursing note reviewed.  Constitutional:      General: He is not in acute distress.    Appearance: Normal appearance. He is not ill-appearing, toxic-appearing or diaphoretic.  HENT:     Head: Normocephalic and atraumatic.     Right Ear: External ear normal.     Left Ear: External ear normal.     Nose: Nose normal.     Mouth/Throat:     Mouth: Mucous membranes are moist.     Pharynx: Oropharynx is clear.  Eyes:     General: No scleral icterus.       Right eye: No discharge.        Left eye: No discharge.     Extraocular Movements: Extraocular movements intact.     Conjunctiva/sclera: Conjunctivae normal.     Pupils: Pupils are equal, round, and reactive to light.  Cardiovascular:     Rate and Rhythm: Normal rate and regular rhythm.     Pulses: Normal pulses.     Heart sounds: Normal heart sounds. No murmur heard.   No friction rub. No gallop.  Pulmonary:     Effort: Pulmonary effort is normal. No respiratory distress.     Breath sounds: Normal breath sounds. No stridor. No wheezing, rhonchi or rales.  Chest:     Chest  wall: No tenderness.  Musculoskeletal:        General: Normal range of motion.     Cervical back: Normal range of motion and neck supple.  Skin:    General: Skin is warm and dry.     Capillary Refill: Capillary refill takes less than 2 seconds.     Coloration: Skin is not jaundiced or pale.     Findings: No bruising, erythema, lesion or rash.  Neurological:     General: No focal deficit present.     Mental Status: He is alert and oriented to person, place, and time. Mental status is at baseline.  Psychiatric:        Mood and Affect: Mood normal.        Behavior: Behavior normal.        Thought  Content: Thought content normal.        Judgment: Judgment normal.    Results for orders placed or performed in visit on 16/10/96  Basic metabolic panel  Result Value Ref Range   Glucose 131 (H) 65 - 99 mg/dL   BUN 18 6 - 24 mg/dL   Creatinine, Ser 1.42 (H) 0.76 - 1.27 mg/dL   eGFR 57 (L) >59 mL/min/1.73   BUN/Creatinine Ratio 13 9 - 20   Sodium 138 134 - 144 mmol/L   Potassium 4.7 3.5 - 5.2 mmol/L   Chloride 100 96 - 106 mmol/L   CO2 20 20 - 29 mmol/L   Calcium 9.2 8.7 - 10.2 mg/dL      Assessment & Plan:   Problem List Items Addressed This Visit       Cardiovascular and Mediastinum   HTN (hypertension) - Primary    Under good control on current regimen. Continue current regimen. Continue to monitor. Call with any concerns. Refills given. Labs drawn today.       Relevant Medications   lisinopril (ZESTRIL) 20 MG tablet   Other Relevant Orders   Basic metabolic panel     Nervous and Auditory   Neuropathy    Under good control on current regimen. Continue current regimen. Continue to monitor. Call with any concerns. Refills given.        Other Visit Diagnoses     Acute nonintractable headache, unspecified headache type       Likely allergy related. Will treat with zyrtec and check in by mychart in 2 weeks. If not improving, may need further work up.   Relevant Medications   nortriptyline (PAMELOR) 25 MG capsule        Follow up plan: Return in about 6 months (around 10/02/2021), or physical.

## 2021-04-04 NOTE — Assessment & Plan Note (Signed)
Under good control on current regimen. Continue current regimen. Continue to monitor. Call with any concerns. Refills given. Labs drawn today.   

## 2021-04-05 ENCOUNTER — Encounter: Payer: Self-pay | Admitting: Family Medicine

## 2021-04-05 LAB — BASIC METABOLIC PANEL
BUN/Creatinine Ratio: 12 (ref 9–20)
BUN: 17 mg/dL (ref 6–24)
CO2: 25 mmol/L (ref 20–29)
Calcium: 9.6 mg/dL (ref 8.7–10.2)
Chloride: 103 mmol/L (ref 96–106)
Creatinine, Ser: 1.41 mg/dL — ABNORMAL HIGH (ref 0.76–1.27)
Glucose: 95 mg/dL (ref 70–99)
Potassium: 4.2 mmol/L (ref 3.5–5.2)
Sodium: 139 mmol/L (ref 134–144)
eGFR: 57 mL/min/{1.73_m2} — ABNORMAL LOW (ref >59)

## 2021-04-18 ENCOUNTER — Telehealth: Payer: Self-pay | Admitting: Family Medicine

## 2021-04-18 NOTE — Telephone Encounter (Signed)
-----   Message from Valerie Roys, DO sent at 04/04/2021  4:27 PM EDT ----- Check in about headaches

## 2021-04-18 NOTE — Telephone Encounter (Signed)
Called patient, no answer LVM for patient to return call or respond via MyChart.

## 2021-04-18 NOTE — Telephone Encounter (Signed)
Can we please call him and check in on how his headaches are?

## 2021-04-21 NOTE — Telephone Encounter (Signed)
Called and LVM asking for patient to please return my call.  

## 2021-06-15 ENCOUNTER — Other Ambulatory Visit: Payer: Self-pay

## 2021-06-15 ENCOUNTER — Ambulatory Visit
Admission: RE | Admit: 2021-06-15 | Discharge: 2021-06-15 | Disposition: A | Discharge status: Home / Self Care | Payer: 59 | Source: Ambulatory Visit | Attending: Family Medicine | Admitting: Family Medicine

## 2021-06-15 ENCOUNTER — Encounter: Payer: Self-pay | Admitting: Family Medicine

## 2021-06-15 ENCOUNTER — Ambulatory Visit (INDEPENDENT_AMBULATORY_CARE_PROVIDER_SITE_OTHER): Payer: 59 | Admitting: Family Medicine

## 2021-06-15 VITALS — BP 137/82 | HR 82 | Temp 98.2°F | Wt 219.2 lb

## 2021-06-15 DIAGNOSIS — R1030 Lower abdominal pain, unspecified: Secondary | ICD-10-CM | POA: Diagnosis not present

## 2021-06-15 DIAGNOSIS — K769 Liver disease, unspecified: Secondary | ICD-10-CM | POA: Diagnosis not present

## 2021-06-15 DIAGNOSIS — I700 Atherosclerosis of aorta: Secondary | ICD-10-CM | POA: Diagnosis not present

## 2021-06-15 DIAGNOSIS — R109 Unspecified abdominal pain: Secondary | ICD-10-CM | POA: Diagnosis not present

## 2021-06-15 LAB — CBC WITH DIFFERENTIAL/PLATELET
Hematocrit: 44.6 % (ref 37.5–51.0)
Hemoglobin: 15.7 g/dL (ref 13.0–17.7)
Lymphocytes Absolute: 2.9 10*3/uL (ref 0.7–3.1)
Lymphs: 21 %
MCH: 31.3 pg (ref 26.6–33.0)
MCHC: 35.2 g/dL (ref 31.5–35.7)
MCV: 89 fL (ref 79–97)
MID (Absolute): 1 10*3/uL (ref 0.1–1.6)
MID: 8 %
Neutrophils Absolute: 9.7 10*3/uL — ABNORMAL HIGH (ref 1.4–7.0)
Neutrophils: 71 %
Platelets: 340 10*3/uL (ref 150–450)
RBC: 5.02 x10E6/uL (ref 4.14–5.80)
RDW: 13.5 % (ref 11.6–15.4)
WBC: 13.6 10*3/uL — ABNORMAL HIGH (ref 3.4–10.8)

## 2021-06-15 LAB — URINALYSIS, ROUTINE W REFLEX MICROSCOPIC
Bilirubin, UA: NEGATIVE
Glucose, UA: NEGATIVE
Ketones, UA: NEGATIVE
Leukocytes,UA: NEGATIVE
Nitrite, UA: NEGATIVE
RBC, UA: NEGATIVE
Specific Gravity, UA: 1.02 (ref 1.005–1.030)
Urobilinogen, Ur: 0.2 mg/dL (ref 0.2–1.0)
pH, UA: 6.5 (ref 5.0–7.5)

## 2021-06-15 LAB — POCT I-STAT CREATININE: Creatinine, Ser: 1.6 mg/dL — ABNORMAL HIGH (ref 0.61–1.24)

## 2021-06-15 IMAGING — CT CT ABD-PELV W/ CM
2 of 5 series · 17 of 46 positions shown, 19 images · IV contrast (APPLIED)
Comparison: None.

CLINICAL DATA: Left lower quadrant pain

EXAM:
CT ABDOMEN AND PELVIS WITH CONTRAST
TECHNIQUE: Multidetector CT imaging of the abdomen and pelvis was performed
using the standard protocol following bolus administration of
intravenous contrast.
CONTRAST:  100mL OMNIPAQUE IOHEXOL 300 MG/ML  SOLN

[Series 2: routine abd/pel with · axial · 0.81mm/px · z∈[-397,+18]mm · 14 of 95 slices shown, 16 images]
[im 6/95  soft-tissue]
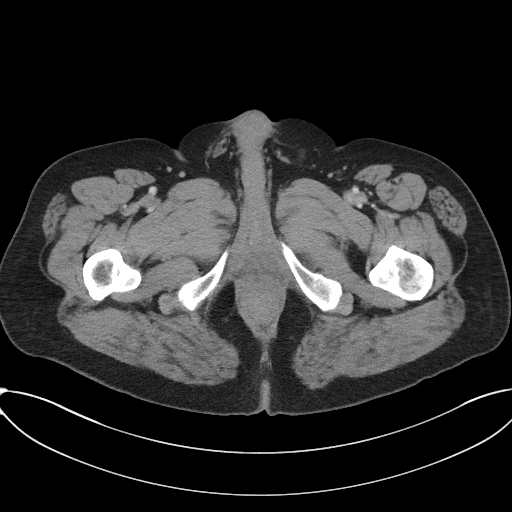
[im 6/95  bone]
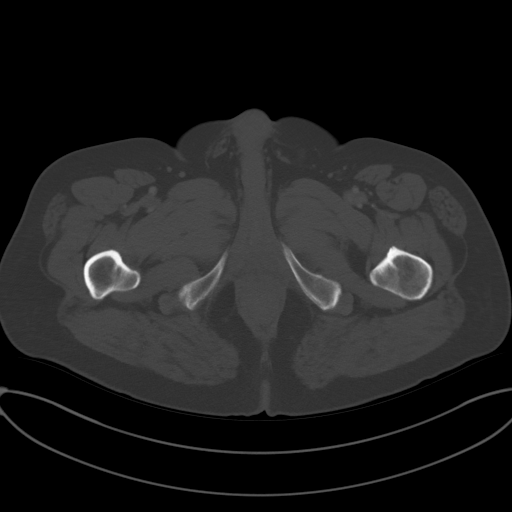
[im 12/95  soft-tissue]
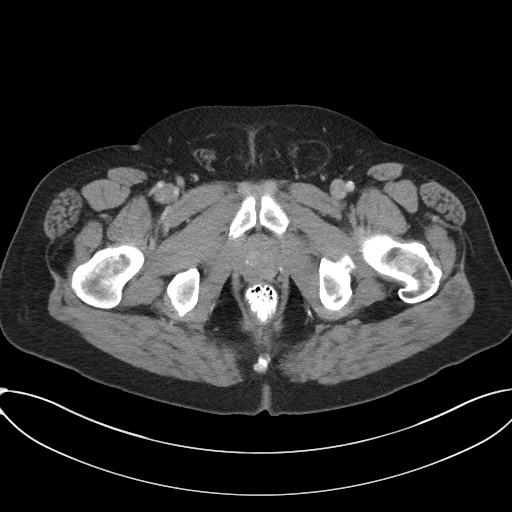
[im 17/95  soft-tissue]
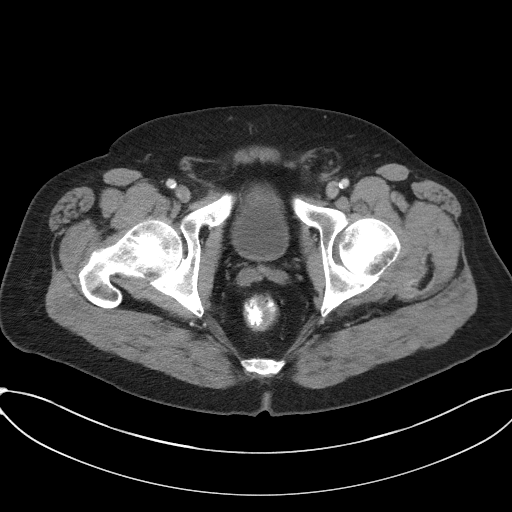
[im 28/95  soft-tissue]
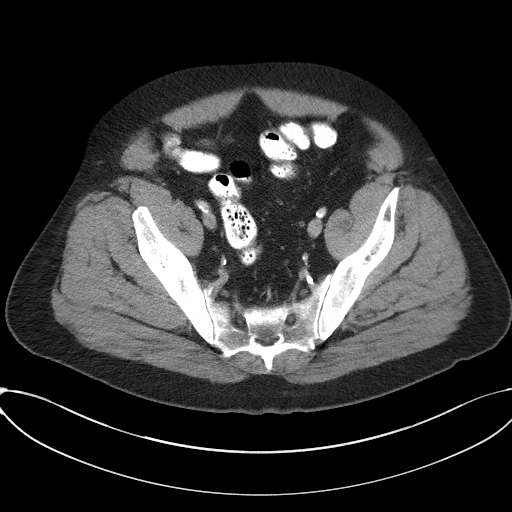
[im 34/95  soft-tissue]
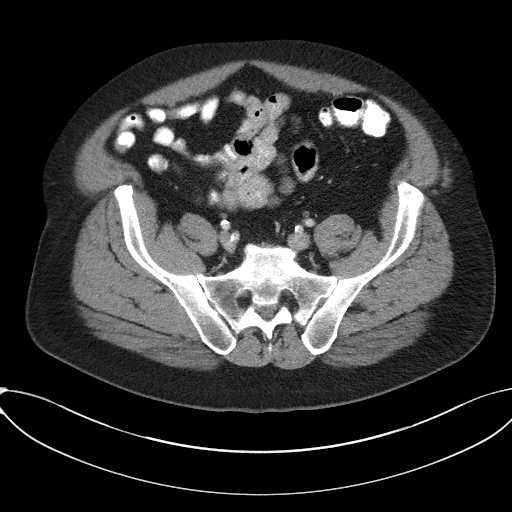
[im 39/95  soft-tissue]
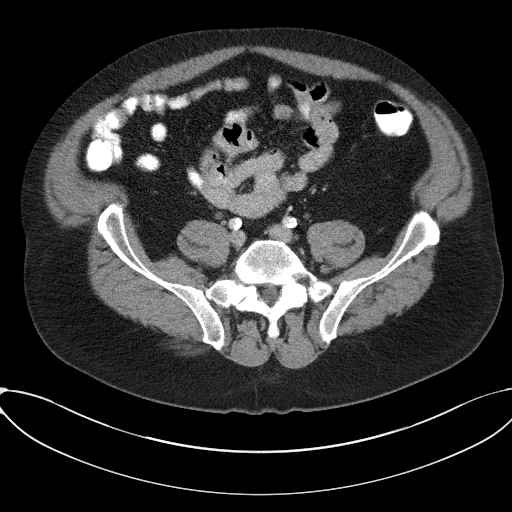
[im 45/95  soft-tissue]
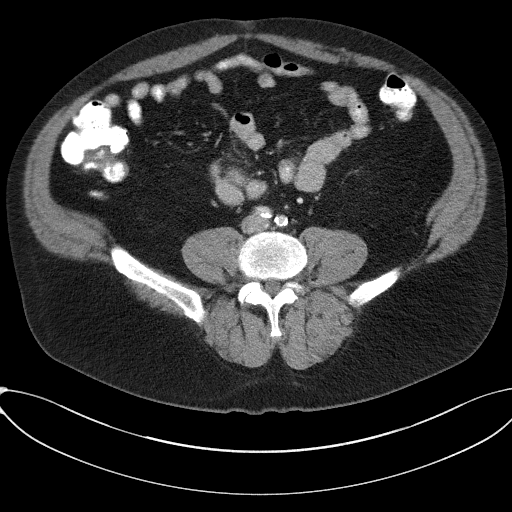
[im 50/95  soft-tissue]
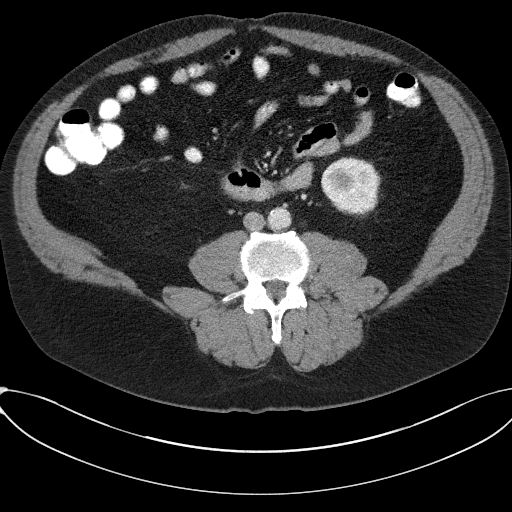
[im 56/95  soft-tissue]
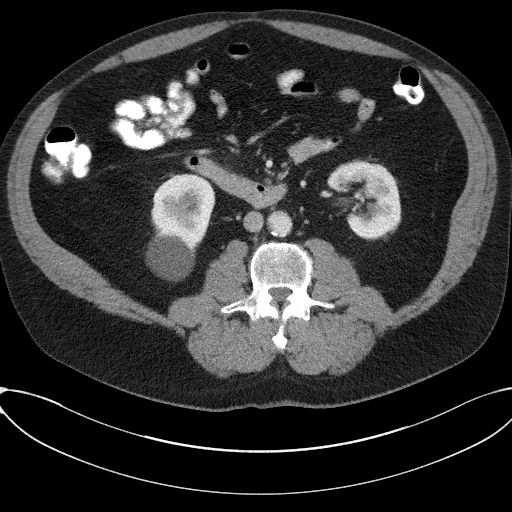
[im 56/95  bone]
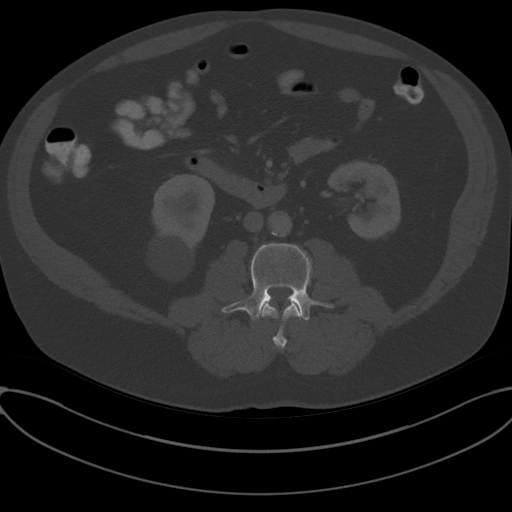
[im 61/95  soft-tissue]
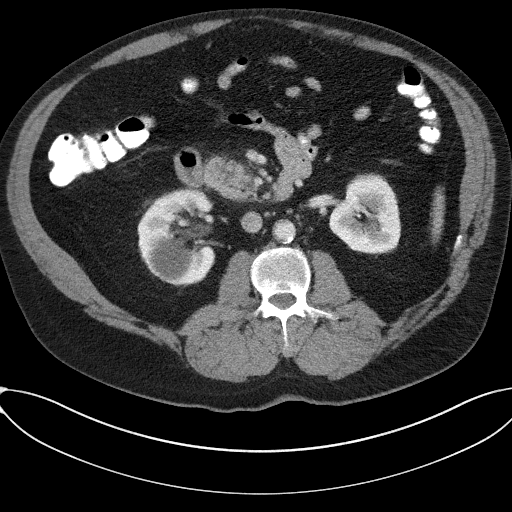
[im 72/95  soft-tissue]
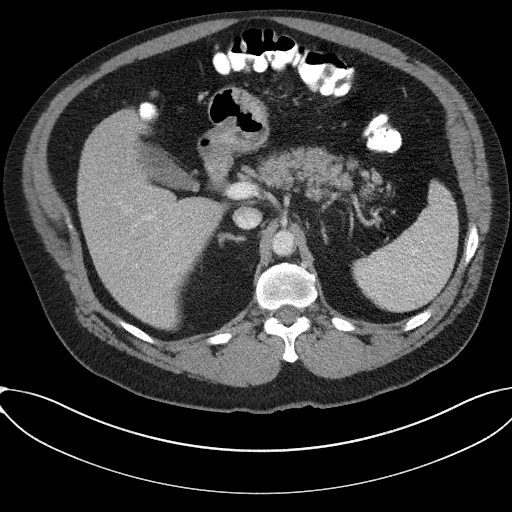
[im 78/95  soft-tissue]
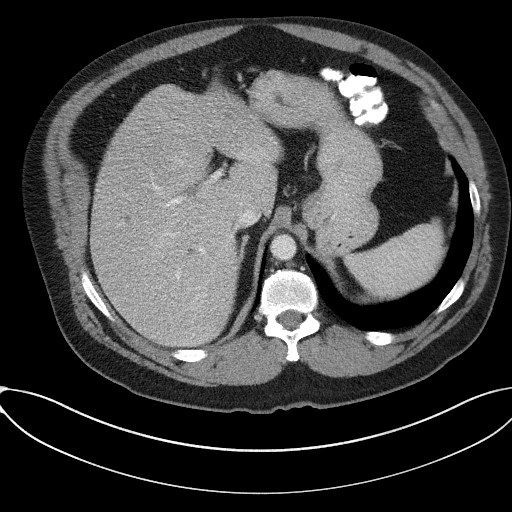
[im 83/95  soft-tissue]
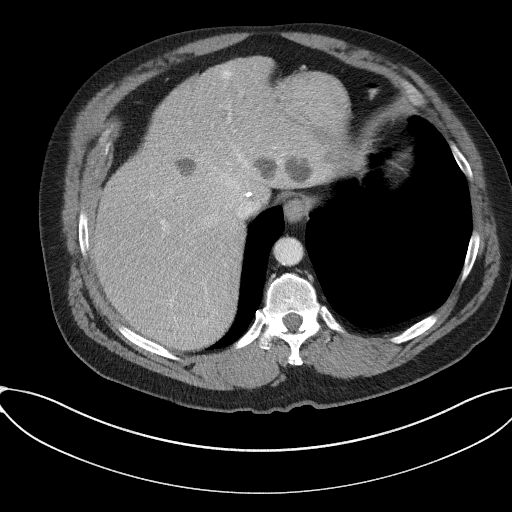
[im 89/95  soft-tissue]
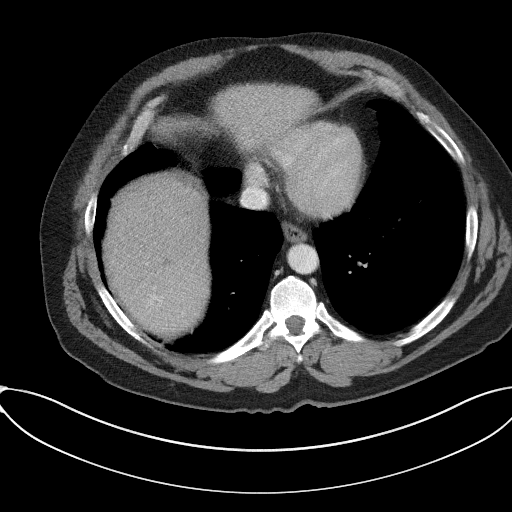

[Series 5: coronal st · coronal · 0.88mm/px · 3 of 109 slices shown]
[im 37/109  soft-tissue]
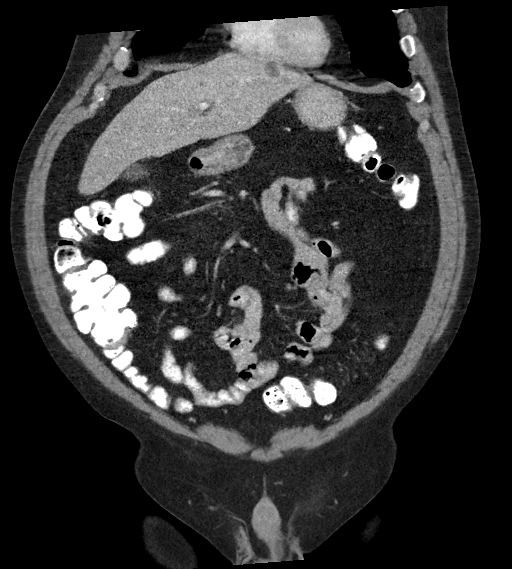
[im 49/109  soft-tissue]
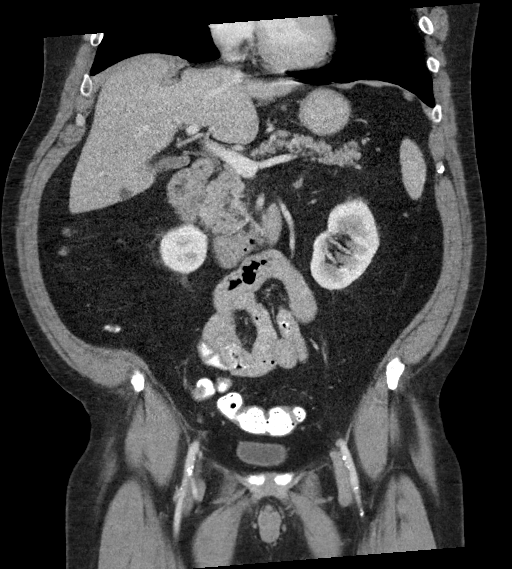
[im 61/109  soft-tissue]
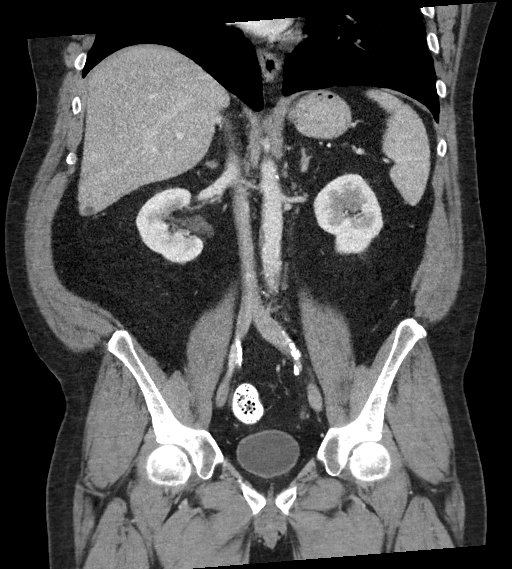

[17 of 46 positions shown; findings below may reference images not displayed]

FINDINGS: Lower chest: No acute abnormality.

Hepatobiliary: Multiple low-density liver lesions probably reflect
cysts. Gallbladder is unremarkable. No biliary dilatation.

Pancreas: Unremarkable.

Spleen: Unremarkable.

Adrenals/Urinary Tract: Adrenals are unremarkable. Right renal cyst.
Too small to characterize low-density left renal lesion may reflect
an additional cyst. No renal calculi. Bladder is unremarkable.

Stomach/Bowel: Stomach is within normal limits. Bowel is normal in
caliber. Normal appendix.

Vascular/Lymphatic: Atherosclerosis.  No enlarged nodes.

Reproductive: Unremarkable.

Other: Small fat containing left inguinal hernia.  No free fluid.

Musculoskeletal: Degenerative disc disease at L5-S1 with facet
hypertrophy.
IMPRESSION: No acute abnormality.

Small fat containing left inguinal hernia.

Aortic Atherosclerosis ([2K]-[2K]).

## 2021-06-15 MED ORDER — IOHEXOL 300 MG/ML  SOLN
100.0000 mL | Freq: Once | INTRAMUSCULAR | Status: AC | PRN
  Administered 2021-06-15: 100 mL via INTRAVENOUS

## 2021-06-15 NOTE — Progress Notes (Signed)
BP 137/82    Pulse 82    Temp 98.2 F (36.8 C)    Wt 219 lb 3.2 oz (99.4 kg)    SpO2 96%    BMI 34.13 kg/m    Subjective:    Patient ID: Douglas Murillo, male    DOB: 09/04/1960, 61 y.o.   MRN: [id number]  HPI: Douglas Murillo is a 61 y.o. male  Chief Complaint  Patient presents with   Abdominal Pain    Patient states he is having abdominal pain for about a week. Patient was prescribed amoxicillin on 12/30 and took about 5 doses, patient stopped taking medication. Patient was dry heaving, and then diarrhea started. Patient is unsure if it is related to medication.     ABDOMINAL PAIN  Duration:almost 2 weeks Onset: sudden Severity: moderate to severe Quality: aching, cramping Location:  diffuse  Episode duration:  Radiation: no Frequency: intermittent Status: fluctuating Treatments attempted: none Fever: no Nausea: yes Vomiting: yes Weight loss: yes Decreased appetite: yes Diarrhea: yes Constipation: no Blood in stool: no Heartburn: no Jaundice: no Rash: no Dysuria/urinary frequency: no Hematuria: no History of sexually transmitted disease: no Recurrent NSAID use: no  Relevant past medical, surgical, family and social history reviewed and updated as indicated. Interim medical history since our last visit reviewed. Allergies and medications reviewed and updated.  Review of Systems  Constitutional: Negative.   Respiratory: Negative.    Cardiovascular: Negative.   Gastrointestinal:  Positive for abdominal pain, diarrhea, nausea and vomiting. Negative for abdominal distention, anal bleeding, blood in stool, constipation and rectal pain.  Musculoskeletal: Negative.   Neurological: Negative.   Psychiatric/Behavioral: Negative.     Per HPI unless specifically indicated above     Objective:    BP 137/82    Pulse 82    Temp 98.2 F (36.8 C)    Wt 219 lb 3.2 oz (99.4 kg)    SpO2 96%    BMI 34.13 kg/m   Wt Readings from Last 3 Encounters:  06/15/21 219  lb 3.2 oz (99.4 kg)  04/04/21 222 lb 12.8 oz (101.1 kg)  09/27/20 229 lb 12.8 oz (104.2 kg)    Physical Exam Vitals and nursing note reviewed.  Constitutional:      General: He is not in acute distress.    Appearance: Normal appearance. He is well-developed. He is obese. He is not ill-appearing, toxic-appearing or diaphoretic.  HENT:     Head: Normocephalic and atraumatic.     Right Ear: External ear normal.     Left Ear: External ear normal.     Nose: Nose normal.     Mouth/Throat:     Mouth: Mucous membranes are moist.     Pharynx: Oropharynx is clear.  Eyes:     General: No scleral icterus.       Right eye: No discharge.        Left eye: No discharge.     Extraocular Movements: Extraocular movements intact.     Conjunctiva/sclera: Conjunctivae normal.     Pupils: Pupils are equal, round, and reactive to light.  Cardiovascular:     Rate and Rhythm: Normal rate and regular rhythm.     Pulses: Normal pulses.     Heart sounds: Normal heart sounds. No murmur heard.   No friction rub. No gallop.  Pulmonary:     Effort: Pulmonary effort is normal. No respiratory distress.     Breath sounds: Normal breath sounds. No stridor. No wheezing, rhonchi or  rales.  Chest:     Chest wall: No tenderness.  Abdominal:     General: Abdomen is flat.     Palpations: Abdomen is soft.     Tenderness: There is abdominal tenderness in the right upper quadrant and right lower quadrant.  Musculoskeletal:        General: Normal range of motion.     Cervical back: Normal range of motion and neck supple.  Skin:    General: Skin is warm and dry.     Capillary Refill: Capillary refill takes less than 2 seconds.     Coloration: Skin is not jaundiced or pale.     Findings: No bruising, erythema, lesion or rash.  Neurological:     General: No focal deficit present.     Mental Status: He is alert and oriented to person, place, and time. Mental status is at baseline.  Psychiatric:        Mood and  Affect: Mood normal.        Behavior: Behavior normal.        Thought Content: Thought content normal.        Judgment: Judgment normal.    Results for orders placed or performed in visit on 06/15/21  Urinalysis, Routine w reflex microscopic  Result Value Ref Range   Specific Gravity, UA 1.020 1.005 - 1.030   pH, UA 6.5 5.0 - 7.5   Color, UA Yellow Yellow   Appearance Ur Clear Clear   Leukocytes,UA Negative Negative   Protein,UA Trace (A) Negative/Trace   Glucose, UA Negative Negative   Ketones, UA Negative Negative   RBC, UA Negative Negative   Bilirubin, UA Negative Negative   Urobilinogen, Ur 0.2 0.2 - 1.0 mg/dL   Nitrite, UA Negative Negative      Assessment & Plan:   Problem List Items Addressed This Visit   None Visit Diagnoses     Abdominal pain, unspecified abdominal location    -  Primary   Concern for diverticulitis vs c diff. will check CT and labs and send home with stool kit. Continue to monitor. Call with ay concerns.    Relevant Orders   CBC With Differential/Platelet   Comprehensive metabolic panel   Urinalysis, Routine w reflex microscopic (Completed)   Stool C-Diff Toxin Assay   Stool Culture   Ova and parasite examination   Fecal leukocytes   Fecal occult blood, imunochemical([facility])   Lower abdominal pain       Relevant Orders   CT Abdomen Pelvis W Contrast        Follow up plan: Return if symptoms worsen or fail to improve.

## 2021-06-16 ENCOUNTER — Telehealth: Payer: Self-pay | Admitting: Family Medicine

## 2021-06-16 LAB — COMPREHENSIVE METABOLIC PANEL
ALT: 17 IU/L (ref 0–44)
AST: 13 IU/L (ref 0–40)
Albumin/Globulin Ratio: 1.4 (ref 1.2–2.2)
Albumin: 4.2 g/dL (ref 3.8–4.9)
Alkaline Phosphatase: 112 IU/L (ref 44–121)
BUN/Creatinine Ratio: 12 (ref 9–20)
BUN: 16 mg/dL (ref 6–24)
Bilirubin Total: 0.7 mg/dL (ref 0.0–1.2)
CO2: 23 mmol/L (ref 20–29)
Calcium: 9.6 mg/dL (ref 8.7–10.2)
Chloride: 102 mmol/L (ref 96–106)
Creatinine, Ser: 1.39 mg/dL — ABNORMAL HIGH (ref 0.76–1.27)
Globulin, Total: 2.9 g/dL (ref 1.5–4.5)
Glucose: 98 mg/dL (ref 70–99)
Potassium: 4.7 mmol/L (ref 3.5–5.2)
Sodium: 138 mmol/L (ref 134–144)
Total Protein: 7.1 g/dL (ref 6.0–8.5)
eGFR: 58 mL/min/{1.73_m2} — ABNORMAL LOW (ref >59)

## 2021-06-16 NOTE — Telephone Encounter (Signed)
Copied from [place] [id number]. Topic: General - Other >> Jun 16, 2021  9:47 AM Valere Dross wrote: Reason for CRM: Pts wife called in stating pt had a CT scan yesterday and they were wanting to know the results, and requested a call back, please advise.

## 2021-06-16 NOTE — Telephone Encounter (Signed)
See result note, patient was notified of results.

## 2021-06-17 DIAGNOSIS — R109 Unspecified abdominal pain: Secondary | ICD-10-CM | POA: Diagnosis not present

## 2021-06-18 LAB — FECAL OCCULT BLOOD, IMMUNOCHEMICAL: Fecal Occult Bld: NEGATIVE

## 2021-06-23 LAB — STOOL CULTURE: E coli, Shiga toxin Assay: NEGATIVE

## 2021-06-23 LAB — OVA AND PARASITE EXAMINATION

## 2021-06-23 LAB — FECAL LEUKOCYTES

## 2021-06-23 LAB — CLOSTRIDIUM DIFFICILE EIA: C difficile Toxins A+B, EIA: NEGATIVE

## 2021-06-23 NOTE — Progress Notes (Signed)
Contacted via [facility] afternoon Douglas Murillo, your stool samples have returned showing no infection.  If any questions please let us know.

## 2021-10-03 ENCOUNTER — Encounter: Payer: Self-pay | Admitting: Family Medicine

## 2021-10-03 ENCOUNTER — Ambulatory Visit (INDEPENDENT_AMBULATORY_CARE_PROVIDER_SITE_OTHER): Payer: 59 | Admitting: Family Medicine

## 2021-10-03 VITALS — BP 138/86 | HR 87 | Temp 98.5°F | Ht 67.21 in | Wt 212.8 lb

## 2021-10-03 DIAGNOSIS — Z0000 Encounter for general adult medical examination without abnormal findings: Secondary | ICD-10-CM

## 2021-10-03 DIAGNOSIS — I10 Essential (primary) hypertension: Secondary | ICD-10-CM | POA: Diagnosis not present

## 2021-10-03 DIAGNOSIS — G629 Polyneuropathy, unspecified: Secondary | ICD-10-CM | POA: Diagnosis not present

## 2021-10-03 LAB — URINALYSIS, ROUTINE W REFLEX MICROSCOPIC
Bilirubin, UA: NEGATIVE
Glucose, UA: NEGATIVE
Ketones, UA: NEGATIVE
Leukocytes,UA: NEGATIVE
Nitrite, UA: NEGATIVE
Protein,UA: NEGATIVE
RBC, UA: NEGATIVE
Specific Gravity, UA: 1.015 (ref 1.005–1.030)
Urobilinogen, Ur: 0.2 mg/dL (ref 0.2–1.0)
pH, UA: 7 (ref 5.0–7.5)

## 2021-10-03 LAB — MICROALBUMIN, URINE WAIVED
Creatinine, Urine Waived: 50 mg/dL (ref 10–300)
Microalb, Ur Waived: 10 mg/L (ref 0–19)
Microalb/Creat Ratio: <30 mg/g (ref <30)

## 2021-10-03 MED ORDER — LISINOPRIL 20 MG PO TABS: ORAL_TABLET | ORAL | 1 refills | Status: DC

## 2021-10-03 MED ORDER — NORTRIPTYLINE HCL 25 MG PO CAPS
25.0000 mg | ORAL_CAPSULE | Freq: Every day | ORAL | 1 refills | Status: DC
Start: 2021-10-03 — End: 2022-04-07

## 2021-10-03 NOTE — Progress Notes (Signed)
? ?BP 138/86   Pulse 87   Temp 98.5 ?F (36.9 ?C) (Oral)   Ht 5' 7.21" (1.707 m)   Wt 212 lb 12.8 oz (96.5 kg)   SpO2 98%   BMI 33.13 kg/m?   ? ?Subjective:  ? ? Patient ID: Douglas Murillo, male    DOB: Aug 30, 1960, 61 y.o.   MRN: [id number] ? ?HPI: ?Douglas Murillo is a 61 y.o. male presenting on 10/03/2021 for comprehensive medical examination. Current medical complaints include: ? ?HYPERTENSION ?Hypertension status: controlled  ?Satisfied with current treatment? yes ?Duration of hypertension: chronic ?BP monitoring frequency:  not checking ?BP medication side effects:  no ?Medication compliance: excellent compliance ?Previous BP meds: lisinopril ?Aspirin: no ?Recurrent headaches: no ?Visual changes: no ?Palpitations: no ?Dyspnea: no ?Chest pain: no ?Lower extremity edema: no ?Dizzy/lightheaded: no ? ?He currently lives with: wife ?Interim Problems from his last visit: no ? ?Depression Screen done today and results listed below:  ? ?  06/15/2021  ?  9:44 AM 09/27/2020  ?  2:59 PM 03/29/2020  ?  4:22 PM 02/14/2019  ?  3:24 PM 10/30/2017  ?  3:11 PM  ?Depression screen PHQ 2/9  ?Decreased Interest 0 0 0 0 0  ?Down, Depressed, Hopeless 0 0 0 0 0  ?PHQ - 2 Score 0 0 0 0 0  ?Altered sleeping 0  0  1  ?Tired, decreased energy 0  0  1  ?Change in appetite 0  0  0  ?Feeling bad or failure about yourself  0  0  0  ?Trouble concentrating 0  0  0  ?Moving slowly or fidgety/restless 0  0  0  ?Suicidal thoughts 0  0  0  ?PHQ-9 Score 0  0  2  ?Difficult doing work/chores   Not difficult at all  Not difficult at all  ? ? ?Past Medical History:  ?Past Medical History:  ?Diagnosis Date  ? GERD (gastroesophageal reflux disease)   ? Hypertension   ? ? ?Surgical History:  ?Past Surgical History:  ?Procedure Laterality Date  ? ANAL FISSURE REPAIR    ? CHOLECYSTECTOMY    ? ? ?Medications:  ?Current Outpatient Medications on File Prior to Visit  ?Medication Sig  ? Acetaminophen (TYLENOL 8 HOUR PO) Take by mouth as needed.  ?  esomeprazole (NEXIUM) 20 MG capsule Take 20 mg by mouth daily at 12 noon.  ? Multiple Vitamins-Minerals (MULTIVITAMIN PO) Take by mouth daily.  ? ?No current facility-administered medications on file prior to visit.  ? ? ?Allergies:  ?Allergies  ?Allergen Reactions  ? Prednisone Other (See Comments)  ?  Joint Pain  ? Hctz [Hydrochlorothiazide] Other (See Comments)  ?  Headache  ? ? ?Social History:  ?Social History  ? ?Socioeconomic History  ? Marital status: Married  ?  Spouse name: Not on file  ? Number of children: Not on file  ? Years of education: Not on file  ? Highest education level: Not on file  ?Occupational History  ? Not on file  ?Tobacco Use  ? Smoking status: Former  ?  Types: Cigarettes  ?  Quit date: 12/20/2015  ?  Years since quitting: 5.7  ? Smokeless tobacco: Never  ?Vaping Use  ? Vaping Use: Former  ?Substance and Sexual Activity  ? Alcohol use: No  ? Drug use: Yes  ?  Types: Marijuana  ? Sexual activity: Yes  ?Other Topics Concern  ? Not on file  ?Social History Narrative  ? Not  on file  ? ?Social Determinants of Health  ? ?Financial Resource Strain: Not on file  ?Food Insecurity: Not on file  ?Transportation Needs: Not on file  ?Physical Activity: Not on file  ?Stress: Not on file  ?Social Connections: Not on file  ?Intimate Partner Violence: Not on file  ? ?Social History  ? ?Tobacco Use  ?Smoking Status Former  ? Types: Cigarettes  ? Quit date: 12/20/2015  ? Years since quitting: 5.7  ?Smokeless Tobacco Never  ? ?Social History  ? ?Substance and Sexual Activity  ?Alcohol Use No  ? ? ?Family History:  ?Family History  ?Problem Relation Age of Onset  ? Arthritis Mother   ? Cancer Mother   ?     breast  ? Cancer Father   ?     lymphoma  ? Diabetes Father   ? Hearing loss Father   ? Heart disease Father   ? Hypertension Father   ? Kidney disease Father   ? Stroke Maternal Grandmother   ? Arthritis Maternal Grandmother   ? Birth defects Maternal Grandfather   ? Arthritis Paternal Grandmother   ?  Heart disease Paternal Grandfather   ? Graves' disease Daughter   ? Fibromyalgia Sister   ? Heart disease Brother   ? Cancer Brother   ? ? ?Past medical history, surgical history, medications, allergies, family history and social history reviewed with patient today and changes made to appropriate areas of the chart.  ? ?Review of Systems  ?Constitutional: Negative.   ?HENT:  Positive for ear pain. Negative for congestion, ear discharge, hearing loss, nosebleeds, sinus pain, sore throat and tinnitus.   ?Eyes: Negative.   ?Respiratory: Negative.  Negative for stridor.   ?Cardiovascular: Negative.   ?Gastrointestinal:  Positive for heartburn. Negative for abdominal pain, blood in stool, constipation, diarrhea, melena, nausea and vomiting.  ?Genitourinary: Negative.   ?Musculoskeletal: Negative.   ?Skin: Negative.   ?Neurological: Negative.   ?Endo/Heme/Allergies: Negative.   ?Psychiatric/Behavioral: Negative.    ?All other ROS negative except what is listed above and in the HPI.  ? ?   ?Objective:  ?  ?BP 138/86   Pulse 87   Temp 98.5 ?F (36.9 ?C) (Oral)   Ht 5' 7.21" (1.707 m)   Wt 212 lb 12.8 oz (96.5 kg)   SpO2 98%   BMI 33.13 kg/m?   ?Wt Readings from Last 3 Encounters:  ?10/03/21 212 lb 12.8 oz (96.5 kg)  ?06/15/21 219 lb 3.2 oz (99.4 kg)  ?04/04/21 222 lb 12.8 oz (101.1 kg)  ?  ?Physical Exam ?Vitals and nursing note reviewed.  ?Constitutional:   ?   General: He is not in acute distress. ?   Appearance: Normal appearance. He is obese. He is not ill-appearing, toxic-appearing or diaphoretic.  ?HENT:  ?   Head: Normocephalic and atraumatic.  ?   Right Ear: Tympanic membrane, ear canal and external ear normal. There is no impacted cerumen.  ?   Left Ear: Tympanic membrane, ear canal and external ear normal. There is no impacted cerumen.  ?   Nose: Nose normal. No congestion or rhinorrhea.  ?   Mouth/Throat:  ?   Mouth: Mucous membranes are moist.  ?   Pharynx: Oropharynx is clear. No oropharyngeal exudate or  posterior oropharyngeal erythema.  ?Eyes:  ?   General: No scleral icterus.    ?   Right eye: No discharge.     ?   Left eye: No discharge.  ?  Extraocular Movements: Extraocular movements intact.  ?   Conjunctiva/sclera: Conjunctivae normal.  ?   Pupils: Pupils are equal, round, and reactive to light.  ?Neck:  ?   Vascular: No carotid bruit.  ?Cardiovascular:  ?   Rate and Rhythm: Normal rate and regular rhythm.  ?   Pulses: Normal pulses.  ?   Heart sounds: No murmur heard. ?  No friction rub. No gallop.  ?Pulmonary:  ?   Effort: Pulmonary effort is normal. No respiratory distress.  ?   Breath sounds: Normal breath sounds. No stridor. No wheezing, rhonchi or rales.  ?Chest:  ?   Chest wall: No tenderness.  ?Abdominal:  ?   General: Abdomen is flat. Bowel sounds are normal. There is no distension.  ?   Palpations: Abdomen is soft. There is no mass.  ?   Tenderness: There is no abdominal tenderness. There is no right CVA tenderness, left CVA tenderness, guarding or rebound.  ?   Hernia: A hernia (umbilical) is present.  ?Genitourinary: ?   Comments: Genital exam deferred with shared decision making ?Musculoskeletal:     ?   General: No swelling, tenderness, deformity or signs of injury.  ?   Cervical back: Normal range of motion and neck supple. No rigidity. No muscular tenderness.  ?   Right lower leg: No edema.  ?   Left lower leg: No edema.  ?Lymphadenopathy:  ?   Cervical: No cervical adenopathy.  ?Skin: ?   General: Skin is warm and dry.  ?   Capillary Refill: Capillary refill takes less than 2 seconds.  ?   Coloration: Skin is not jaundiced or pale.  ?   Findings: No bruising, erythema, lesion or rash.  ?Neurological:  ?   General: No focal deficit present.  ?   Mental Status: He is alert and oriented to person, place, and time. Mental status is at baseline.  ?   Cranial Nerves: No cranial nerve deficit.  ?   Sensory: No sensory deficit.  ?   Motor: No weakness.  ?   Coordination: Coordination normal.  ?    Gait: Gait normal.  ?   Deep Tendon Reflexes: Reflexes normal.  ?Psychiatric:     ?   Mood and Affect: Mood normal.     ?   Behavior: Behavior normal.     ?   Thought Content: Thought content normal.     ?   Judgment: Jud

## 2021-10-03 NOTE — Assessment & Plan Note (Signed)
Under good control on current regimen. Continue current regimen. Continue to monitor. Call with any concerns. Refills given. Labs drawn today.   

## 2021-10-04 ENCOUNTER — Other Ambulatory Visit: Payer: Self-pay | Admitting: Family Medicine

## 2021-10-04 DIAGNOSIS — R972 Elevated prostate specific antigen [PSA]: Secondary | ICD-10-CM

## 2021-10-04 LAB — CBC WITH DIFFERENTIAL/PLATELET
Basophils Absolute: 0.1 10*3/uL (ref 0.0–0.2)
Basos: 1 %
EOS (ABSOLUTE): 0.2 10*3/uL (ref 0.0–0.4)
Eos: 2 %
Hematocrit: 45.4 % (ref 37.5–51.0)
Hemoglobin: 15.5 g/dL (ref 13.0–17.7)
Immature Grans (Abs): 0.1 10*3/uL (ref 0.0–0.1)
Immature Granulocytes: 1 %
Lymphocytes Absolute: 2.2 10*3/uL (ref 0.7–3.1)
Lymphs: 20 %
MCH: 30.9 pg (ref 26.6–33.0)
MCHC: 34.1 g/dL (ref 31.5–35.7)
MCV: 90 fL (ref 79–97)
Monocytes Absolute: 0.8 10*3/uL (ref 0.1–0.9)
Monocytes: 7 %
Neutrophils Absolute: 7.7 10*3/uL — ABNORMAL HIGH (ref 1.4–7.0)
Neutrophils: 69 %
Platelets: 261 10*3/uL (ref 150–450)
RBC: 5.02 x10E6/uL (ref 4.14–5.80)
RDW: 13 % (ref 11.6–15.4)
WBC: 11.1 10*3/uL — ABNORMAL HIGH (ref 3.4–10.8)

## 2021-10-04 LAB — LIPID PANEL W/O CHOL/HDL RATIO
Cholesterol, Total: 185 mg/dL (ref 100–199)
HDL: 45 mg/dL (ref >39)
LDL Chol Calc (NIH): 104 mg/dL — ABNORMAL HIGH (ref 0–99)
Triglycerides: 212 mg/dL — ABNORMAL HIGH (ref 0–149)
VLDL Cholesterol Cal: 36 mg/dL (ref 5–40)

## 2021-10-04 LAB — COMPREHENSIVE METABOLIC PANEL
ALT: 17 IU/L (ref 0–44)
AST: 17 IU/L (ref 0–40)
Albumin/Globulin Ratio: 2.1 (ref 1.2–2.2)
Albumin: 4.5 g/dL (ref 3.8–4.9)
Alkaline Phosphatase: 93 IU/L (ref 44–121)
BUN/Creatinine Ratio: 11 (ref 10–24)
BUN: 14 mg/dL (ref 8–27)
Bilirubin Total: 0.4 mg/dL (ref 0.0–1.2)
CO2: 25 mmol/L (ref 20–29)
Calcium: 9.7 mg/dL (ref 8.6–10.2)
Chloride: 100 mmol/L (ref 96–106)
Creatinine, Ser: 1.3 mg/dL — ABNORMAL HIGH (ref 0.76–1.27)
Globulin, Total: 2.1 g/dL (ref 1.5–4.5)
Glucose: 106 mg/dL — ABNORMAL HIGH (ref 70–99)
Potassium: 4.2 mmol/L (ref 3.5–5.2)
Sodium: 137 mmol/L (ref 134–144)
Total Protein: 6.6 g/dL (ref 6.0–8.5)
eGFR: 63 mL/min/{1.73_m2} (ref >59)

## 2021-10-04 LAB — PSA: Prostate Specific Ag, Serum: 5.6 ng/mL — ABNORMAL HIGH (ref 0.0–4.0)

## 2021-10-04 LAB — TSH: TSH: 0.672 u[IU]/mL (ref 0.450–4.500)

## 2021-11-14 ENCOUNTER — Encounter: Payer: 59 | Admitting: Family Medicine

## 2021-11-14 ENCOUNTER — Other Ambulatory Visit: Payer: Self-pay

## 2021-11-14 DIAGNOSIS — R972 Elevated prostate specific antigen [PSA]: Secondary | ICD-10-CM

## 2021-11-15 LAB — PSA: Prostate Specific Ag, Serum: 4.4 ng/mL — ABNORMAL HIGH (ref 0.0–4.0)

## 2022-04-07 ENCOUNTER — Encounter: Payer: Self-pay | Admitting: Family Medicine

## 2022-04-07 ENCOUNTER — Ambulatory Visit (INDEPENDENT_AMBULATORY_CARE_PROVIDER_SITE_OTHER): Payer: 59 | Admitting: Family Medicine

## 2022-04-07 VITALS — BP 152/64 | HR 71 | Temp 98.2°F | Wt 225.3 lb

## 2022-04-07 DIAGNOSIS — G629 Polyneuropathy, unspecified: Secondary | ICD-10-CM

## 2022-04-07 DIAGNOSIS — I10 Essential (primary) hypertension: Secondary | ICD-10-CM | POA: Diagnosis not present

## 2022-04-07 MED ORDER — NORTRIPTYLINE HCL 50 MG PO CAPS: 50.0000 mg | ORAL_CAPSULE | Freq: Every day | ORAL | 1 refills | Status: DC

## 2022-04-07 MED ORDER — LISINOPRIL 20 MG PO TABS: ORAL_TABLET | ORAL | 1 refills | Status: DC

## 2022-04-07 NOTE — Progress Notes (Signed)
BP (!) 152/64   Pulse 71   Temp 98.2 F (36.8 C)   Wt 225 lb 4.8 oz (102.2 kg)   SpO2 98%   BMI 35.07 kg/m    Subjective:    Patient ID: Douglas Murillo, male    DOB: May 31, 1961, 61 y.o.   MRN: [id number]  HPI: Douglas Murillo is a 61 y.o. male  Chief Complaint  Patient presents with  . Hypertension  . Peripheral Neuropathy    Patient states he is having neuropathy pain in his feet, thinks he needs an increase in dosage of nortriptyline    HYPERTENSION  Hypertension status: controlled  Satisfied with current treatment? yes Duration of hypertension: chronic BP monitoring frequency:  not checking BP medication side effects:  no Medication compliance: excellent compliance Previous BP meds:lisinopril Aspirin: no Recurrent headaches: no Visual changes: no Palpitations: no Dyspnea: no Chest pain: no Lower extremity edema: no Dizzy/lightheaded: no  NEUROPATHY Neuropathy status: exacerbated  Satisfied with current treatment?: no Medication side effects: no Medication compliance:  excellent compliance Location: feet  Pain: yes Severity: moderate  Quality:  numb and tingling Frequency: constant Bilateral: yes Symmetric: yes Numbness: yes Decreased sensation: yes Weakness: no Context: worse  Relevant past medical, surgical, family and social history reviewed and updated as indicated. Interim medical history since our last visit reviewed. Allergies and medications reviewed and updated.  Review of Systems  Constitutional: Negative.   Respiratory: Negative.    Cardiovascular: Negative.   Gastrointestinal: Negative.   Musculoskeletal: Negative.   Neurological:  Positive for numbness. Negative for dizziness, tremors, seizures, syncope, facial asymmetry, speech difficulty, weakness, light-headedness and headaches.  Psychiatric/Behavioral: Negative.      Per HPI unless specifically indicated above     Objective:    BP (!) 152/64   Pulse 71   Temp  98.2 F (36.8 C)   Wt 225 lb 4.8 oz (102.2 kg)   SpO2 98%   BMI 35.07 kg/m   Wt Readings from Last 3 Encounters:  04/07/22 225 lb 4.8 oz (102.2 kg)  10/03/21 212 lb 12.8 oz (96.5 kg)  06/15/21 219 lb 3.2 oz (99.4 kg)    Physical Exam Vitals and nursing note reviewed.  Constitutional:      General: He is not in acute distress.    Appearance: Normal appearance. He is obese. He is not ill-appearing, toxic-appearing or diaphoretic.  HENT:     Head: Normocephalic and atraumatic.     Right Ear: External ear normal.     Left Ear: External ear normal.     Nose: Nose normal.     Mouth/Throat:     Mouth: Mucous membranes are moist.     Pharynx: Oropharynx is clear.  Eyes:     General: No scleral icterus.       Right eye: No discharge.        Left eye: No discharge.     Extraocular Movements: Extraocular movements intact.     Conjunctiva/sclera: Conjunctivae normal.     Pupils: Pupils are equal, round, and reactive to light.  Cardiovascular:     Rate and Rhythm: Normal rate and regular rhythm.     Pulses: Normal pulses.     Heart sounds: Normal heart sounds. No murmur heard.    No friction rub. No gallop.  Pulmonary:     Effort: Pulmonary effort is normal. No respiratory distress.     Breath sounds: Normal breath sounds. No stridor. No wheezing, rhonchi or rales.  Chest:  Chest wall: No tenderness.  Musculoskeletal:        General: Normal range of motion.     Cervical back: Normal range of motion and neck supple.  Skin:    General: Skin is warm and dry.     Capillary Refill: Capillary refill takes less than 2 seconds.     Coloration: Skin is not jaundiced or pale.     Findings: No bruising, erythema, lesion or rash.  Neurological:     General: No focal deficit present.     Mental Status: He is alert and oriented to person, place, and time. Mental status is at baseline.  Psychiatric:        Mood and Affect: Mood normal.        Behavior: Behavior normal.        Thought  Content: Thought content normal.        Judgment: Judgment normal.    Results for orders placed or performed in visit on 11/14/21  PSA  Result Value Ref Range   Prostate Specific Ag, Serum 4.4 (H) 0.0 - 4.0 ng/mL      Assessment & Plan:   Problem List Items Addressed This Visit       Cardiovascular and Mediastinum   HTN (hypertension) - Primary    Under good control on current regimen. Continue current regimen. Continue to monitor. Call with any concerns. Refills given. Labs drawn today.       Relevant Medications   lisinopril (ZESTRIL) 20 MG tablet   Other Relevant Orders   Basic metabolic panel     Nervous and Auditory   Neuropathy    Not under good control. Will increase to '50mg'$  and recheck 1 month. Call with any concerns.         Follow up plan: Return in about 4 weeks (around 05/05/2022) for recheck neuropathy.

## 2022-04-07 NOTE — Assessment & Plan Note (Signed)
Under good control on current regimen. Continue current regimen. Continue to monitor. Call with any concerns. Refills given. Labs drawn today.   

## 2022-04-07 NOTE — Assessment & Plan Note (Signed)
Not under good control. Will increase to '50mg'$  and recheck 1 month. Call with any concerns.

## 2022-04-08 LAB — BASIC METABOLIC PANEL
BUN/Creatinine Ratio: 13 (ref 10–24)
BUN: 17 mg/dL (ref 8–27)
CO2: 26 mmol/L (ref 20–29)
Calcium: 9.7 mg/dL (ref 8.6–10.2)
Chloride: 102 mmol/L (ref 96–106)
Creatinine, Ser: 1.33 mg/dL — ABNORMAL HIGH (ref 0.76–1.27)
Glucose: 129 mg/dL — ABNORMAL HIGH (ref 70–99)
Potassium: 4.8 mmol/L (ref 3.5–5.2)
Sodium: 141 mmol/L (ref 134–144)
eGFR: 61 mL/min/{1.73_m2} (ref >59)

## 2022-05-08 ENCOUNTER — Encounter: Payer: Self-pay | Admitting: Family Medicine

## 2022-05-08 ENCOUNTER — Ambulatory Visit (INDEPENDENT_AMBULATORY_CARE_PROVIDER_SITE_OTHER): Payer: 59 | Admitting: Family Medicine

## 2022-05-08 VITALS — BP 125/77 | HR 73 | Temp 98.3°F | Ht 67.21 in | Wt 227.4 lb

## 2022-05-08 DIAGNOSIS — G629 Polyneuropathy, unspecified: Secondary | ICD-10-CM | POA: Diagnosis not present

## 2022-05-08 NOTE — Assessment & Plan Note (Signed)
Significantly better. Continue current regimen. Continue to monitor. Call with any concerns.

## 2022-05-08 NOTE — Progress Notes (Signed)
 BP 125/77   Pulse 73   Temp 98.3 F (36.8 C) (Oral)   Ht 5' 7.21" (1.707 m)   Wt 227 lb 6.4 oz (103.1 kg)   SpO2 95%   BMI 35.39 kg/m    Subjective:    Patient ID: Douglas Murillo, male    DOB: 07/23/1960, 61 y.o.   MRN: [id number]  HPI: Douglas Murillo is a 61 y.o. male  Chief Complaint  Patient presents with   Peripheral Neuropathy   NEUROPATHY Neuropathy status: better  Satisfied with current treatment?: no Medication side effects: no Medication compliance:  excellent compliance Location: feet Pain: no Severity: moderate  Quality:  numb and tingling Frequency: constant Bilateral: yes Symmetric: yes Numbness: yes Decreased sensation: yes Weakness: no Context: better   Relevant past medical, surgical, family and social history reviewed and updated as indicated. Interim medical history since our last visit reviewed. Allergies and medications reviewed and updated.  Review of Systems  Constitutional: Negative.   Respiratory: Negative.    Cardiovascular: Negative.   Gastrointestinal: Negative.   Musculoskeletal: Negative.   Neurological:  Positive for numbness. Negative for dizziness, tremors, seizures, syncope, facial asymmetry, speech difficulty, weakness, light-headedness and headaches.  Psychiatric/Behavioral: Negative.      Per HPI unless specifically indicated above     Objective:    BP 125/77   Pulse 73   Temp 98.3 F (36.8 C) (Oral)   Ht 5' 7.21" (1.707 m)   Wt 227 lb 6.4 oz (103.1 kg)   SpO2 95%   BMI 35.39 kg/m   Wt Readings from Last 3 Encounters:  05/08/22 227 lb 6.4 oz (103.1 kg)  04/07/22 225 lb 4.8 oz (102.2 kg)  10/03/21 212 lb 12.8 oz (96.5 kg)    Physical Exam Vitals and nursing note reviewed.  Constitutional:      General: He is not in acute distress.    Appearance: Normal appearance. He is not ill-appearing, toxic-appearing or diaphoretic.  HENT:     Head: Normocephalic and atraumatic.     Right Ear: External  ear normal.     Left Ear: External ear normal.     Nose: Nose normal.     Mouth/Throat:     Mouth: Mucous membranes are moist.     Pharynx: Oropharynx is clear.  Eyes:     General: No scleral icterus.       Right eye: No discharge.        Left eye: No discharge.     Extraocular Movements: Extraocular movements intact.     Conjunctiva/sclera: Conjunctivae normal.     Pupils: Pupils are equal, round, and reactive to light.  Cardiovascular:     Rate and Rhythm: Normal rate and regular rhythm.     Pulses: Normal pulses.     Heart sounds: Normal heart sounds. No murmur heard.    No friction rub. No gallop.  Pulmonary:     Effort: Pulmonary effort is normal. No respiratory distress.     Breath sounds: Normal breath sounds. No stridor. No wheezing, rhonchi or rales.  Chest:     Chest wall: No tenderness.  Musculoskeletal:        General: Normal range of motion.     Cervical back: Normal range of motion and neck supple.  Skin:    General: Skin is warm and dry.     Capillary Refill: Capillary refill takes less than 2 seconds.     Coloration: Skin is not jaundiced or pale.       Findings: No bruising, erythema, lesion or rash.  Neurological:     General: No focal deficit present.     Mental Status: He is alert and oriented to person, place, and time. Mental status is at baseline.  Psychiatric:        Mood and Affect: Mood normal.        Behavior: Behavior normal.        Thought Content: Thought content normal.        Judgment: Judgment normal.     Results for orders placed or performed in visit on 81/01/75  Basic metabolic panel  Result Value Ref Range   Glucose 129 (H) 70 - 99 mg/dL   BUN 17 8 - 27 mg/dL   Creatinine, Ser 1.33 (H) 0.76 - 1.27 mg/dL   eGFR 61 >59 mL/min/1.73   BUN/Creatinine Ratio 13 10 - 24   Sodium 141 134 - 144 mmol/L   Potassium 4.8 3.5 - 5.2 mmol/L   Chloride 102 96 - 106 mmol/L   CO2 26 20 - 29 mmol/L   Calcium 9.7 8.6 - 10.2 mg/dL      Assessment &  Plan:   Problem List Items Addressed This Visit       Nervous and Auditory   Neuropathy - Primary    Significantly better. Continue current regimen. Continue to monitor. Call with any concerns.         Follow up plan: Return in about 5 months (around 10/07/2022) for physical.

## 2022-05-21 ENCOUNTER — Telehealth: Payer: 59 | Admitting: Family

## 2022-05-21 DIAGNOSIS — J019 Acute sinusitis, unspecified: Secondary | ICD-10-CM | POA: Diagnosis not present

## 2022-05-21 MED ORDER — AMOXICILLIN-POT CLAVULANATE 875-125 MG PO TABS: 1.0000 | ORAL_TABLET | Freq: Two times a day (BID) | ORAL | 0 refills | Status: DC

## 2022-05-21 NOTE — Progress Notes (Signed)

## 2022-05-26 ENCOUNTER — Telehealth: Payer: 59 | Admitting: Physician Assistant

## 2022-05-26 DIAGNOSIS — J40 Bronchitis, not specified as acute or chronic: Secondary | ICD-10-CM

## 2022-05-26 MED ORDER — ALBUTEROL SULFATE HFA 108 (90 BASE) MCG/ACT IN AERS: 1.0000 | INHALATION_SPRAY | Freq: Four times a day (QID) | RESPIRATORY_TRACT | 0 refills | Status: DC | PRN

## 2022-05-26 MED ORDER — FLUTICASONE PROPIONATE HFA 110 MCG/ACT IN AERO: 1.0000 | INHALATION_SPRAY | Freq: Two times a day (BID) | RESPIRATORY_TRACT | 0 refills | Status: DC

## 2022-05-26 MED ORDER — BENZONATATE 100 MG PO CAPS
100.0000 mg | ORAL_CAPSULE | Freq: Three times a day (TID) | ORAL | 0 refills | Status: DC | PRN
Start: 2022-05-26 — End: 2022-10-09

## 2022-05-26 MED ORDER — PROMETHAZINE-DM 6.25-15 MG/5ML PO SYRP: 5.0000 mL | ORAL_SOLUTION | Freq: Four times a day (QID) | ORAL | 0 refills | Status: DC | PRN

## 2022-05-26 NOTE — Progress Notes (Signed)
We are sorry that you are not feeling well.  Here is how we plan to help!  Based on your presentation I believe you most likely have A cough due to a virus.  This is called viral bronchitis and is best treated by rest, plenty of fluids and control of the cough.  You may use Ibuprofen or Tylenol as directed to help your symptoms.    Continue Augmentin   In addition you may use A prescription cough medication called Tessalon Perles '100mg'$ . You may take 1-2 capsules every 8 hours as needed for your cough. And Promethazine DM Take 4m every 6 hours as needed for cough. Can use with Tessalon.   I have prescribed Albuterol inhaler Use 1-2 puffs every 6 hours as needed for shortness of breath and wheezing.  I have also prescribed Flovent inhaler. This is an inhaled steroid to use 1 puff twice daily.   From your responses in the eVisit questionnaire you describe inflammation in the upper respiratory tract which is causing a significant cough.  This is commonly called Bronchitis and has four common causes:   Allergies Viral Infections Acid Reflux Bacterial Infection Allergies, viruses and acid reflux are treated by controlling symptoms or eliminating the cause. An example might be a cough caused by taking certain blood pressure medications. You stop the cough by changing the medication. Another example might be a cough caused by acid reflux. Controlling the reflux helps control the cough.  USE OF BRONCHODILATOR ("RESCUE") INHALERS: There is a risk from using your bronchodilator too frequently.  The risk is that over-reliance on a medication which only relaxes the muscles surrounding the breathing tubes can reduce the effectiveness of medications prescribed to reduce swelling and congestion of the tubes themselves.  Although you feel brief relief from the bronchodilator inhaler, your asthma may actually be worsening with the tubes becoming more swollen and filled with mucus.  This can delay other crucial  treatments, such as oral steroid medications. If you need to use a bronchodilator inhaler daily, several times per day, you should discuss this with your provider.  There are probably better treatments that could be used to keep your asthma under control.     HOME CARE Only take medications as instructed by your medical team. Complete the entire course of an antibiotic. Drink plenty of fluids and get plenty of rest. Avoid close contacts especially the very young and the elderly Cover your mouth if you cough or cough into your sleeve. Always remember to wash your hands A steam or ultrasonic humidifier can help congestion.   GET HELP RIGHT AWAY IF: You develop worsening fever. You become short of breath You cough up blood. Your symptoms persist after you have completed your treatment plan MAKE SURE YOU  Understand these instructions. Will watch your condition. Will get help right away if you are not doing well or get worse.    Thank you for choosing an e-visit.  Your e-visit answers were reviewed by a board certified advanced clinical practitioner to complete your personal care plan. Depending upon the condition, your plan could have included both over the counter or prescription medications.  Please review your pharmacy choice. Make sure the pharmacy is open so you can pick up prescription now. If there is a problem, you may contact your provider through [employer] have the prescription routed to another pharmacy.  Your safety is important to [place] If you have drug allergies check your prescription carefully.   For the  next 24 hours you can use MyChart to ask questions about today's visit, request a non-urgent call back, or ask for a work or school excuse. You will get an email in the next two days asking about your experience. I hope that your e-visit has been valuable and will speed your recovery.  I have spent 5 minutes in review of e-visit questionnaire, review and updating  patient chart, medical decision making and response to patient.   Mar Daring, PA-C

## 2022-07-07 ENCOUNTER — Encounter: Payer: Self-pay | Admitting: Family Medicine

## 2022-07-07 ENCOUNTER — Telehealth (INDEPENDENT_AMBULATORY_CARE_PROVIDER_SITE_OTHER): Payer: 59 | Admitting: Family Medicine

## 2022-07-07 DIAGNOSIS — R519 Headache, unspecified: Secondary | ICD-10-CM | POA: Diagnosis not present

## 2022-07-07 MED ORDER — ONDANSETRON HCL 4 MG PO TABS: 4.0000 mg | ORAL_TABLET | Freq: Three times a day (TID) | ORAL | 0 refills | Status: DC | PRN

## 2022-07-07 MED ORDER — KETOROLAC TROMETHAMINE 10 MG PO TABS: 10.0000 mg | ORAL_TABLET | Freq: Four times a day (QID) | ORAL | 0 refills | Status: DC | PRN

## 2022-07-07 NOTE — Progress Notes (Signed)
There were no vitals taken for this visit.   Subjective:    Patient ID: Douglas Murillo, male    DOB: 1960/09/06, 62 y.o.   MRN: [id number]  HPI: Phil Michels is a 62 y.o. male  Chief Complaint  Patient presents with   Headache    Patient says for the past week he has been having a headache for the past week. Patient says the only way he can get relief is laying down.    Has been having a headache for about 11 days. Headache is behind his eyes. Low grade fever last week. He had a little decreased appetite. No sick contacts. No URI symptoms.  Duration: 11 days Onset: sudden Severity: severe Quality: sharp, pulsating Frequency: intermittent Location: behind his eyes Headache duration: constant Radiation: no  Alleviating factors: ibuprofen, hot compresses and laying down  Aggravating factors: moving around Headache status at time of visit: current headache Treatments attempted: rest, heat, APAP, and ibuprofen   Aura: no Nausea:  yes Vomiting: no Photophobia:  yes Phonophobia:  no Effect on social functioning:  yes Confusion:  no Gait disturbance/ataxia:  no Behavioral changes:  no Fevers:  yes   Relevant past medical, surgical, family and social history reviewed and updated as indicated. Interim medical history since our last visit reviewed. Allergies and medications reviewed and updated.  Review of Systems  Constitutional: Negative.   Eyes: Negative.   Respiratory: Negative.    Cardiovascular: Negative.   Gastrointestinal: Negative.   Musculoskeletal: Negative.   Neurological:  Positive for headaches. Negative for dizziness, tremors, seizures, syncope, facial asymmetry, speech difficulty, weakness, light-headedness and numbness.  Psychiatric/Behavioral: Negative.      Per HPI unless specifically indicated above     Objective:    There were no vitals taken for this visit.  Wt Readings from Last 3 Encounters:  05/08/22 227 lb 6.4 oz (103.1 kg)   04/07/22 225 lb 4.8 oz (102.2 kg)  10/03/21 212 lb 12.8 oz (96.5 kg)    Physical Exam Vitals and nursing note reviewed.  Constitutional:      General: He is not in acute distress.    Appearance: Normal appearance. He is well-developed. He is obese. He is not ill-appearing, toxic-appearing or diaphoretic.  HENT:     Head: Normocephalic and atraumatic.     Right Ear: External ear normal.     Left Ear: External ear normal.     Nose: Nose normal.     Mouth/Throat:     Mouth: Mucous membranes are moist.     Pharynx: Oropharynx is clear.  Eyes:     General: No scleral icterus.       Right eye: No discharge.        Left eye: No discharge.     Conjunctiva/sclera: Conjunctivae normal.     Pupils: Pupils are equal, round, and reactive to light.  Pulmonary:     Effort: Pulmonary effort is normal. No respiratory distress.     Comments: Speaking in full sentences Musculoskeletal:        General: Normal range of motion.     Cervical back: Normal range of motion.  Skin:    Coloration: Skin is not jaundiced or pale.     Findings: No bruising, erythema, lesion or rash.  Neurological:     Mental Status: He is alert and oriented to person, place, and time. Mental status is at baseline.  Psychiatric:        Mood and Affect: Mood normal.  Behavior: Behavior normal.        Thought Content: Thought content normal.        Judgment: Judgment normal.     Results for orders placed or performed in visit on 41/28/78  Basic metabolic panel  Result Value Ref Range   Glucose 129 (H) 70 - 99 mg/dL   BUN 17 8 - 27 mg/dL   Creatinine, Ser 1.33 (H) 0.76 - 1.27 mg/dL   eGFR 61 >59 mL/min/1.73   BUN/Creatinine Ratio 13 10 - 24   Sodium 141 134 - 144 mmol/L   Potassium 4.8 3.5 - 5.2 mmol/L   Chloride 102 96 - 106 mmol/L   CO2 26 20 - 29 mmol/L   Calcium 9.7 8.6 - 10.2 mg/dL      Assessment & Plan:   Problem List Items Addressed This Visit   None Visit Diagnoses     Acute nonintractable  headache, unspecified headache type    -  Primary   Concern for migraine. Will treat with toradol and zofran. If not better by Monday, consider imaging. Call with any concerns.   Relevant Medications   ketorolac (TORADOL) 10 MG tablet        Follow up plan: Return if symptoms worsen or fail to improve.   This visit was completed via video visit through MyChart due to the restrictions of the COVID-19 pandemic. All issues as above were discussed and addressed. Physical exam was done as above through visual confirmation on video through MyChart. If it was felt that the patient should be evaluated in the office, they were directed there. The patient verbally consented to this visit. Location of the patient: home Location of the provider: home Those involved with this call:  Provider: Park Liter, DO CMA: Irena Reichmann, [place] Desk/Registration: [employer]  Time spent on call:  15 minutes with patient face to face via video conference. More than 50% of this time was spent in counseling and coordination of care. 23 minutes total spent in review of patient's record and preparation of their chart.

## 2022-08-11 NOTE — Progress Notes (Signed)
This encounter was created in error - please disregard.

## 2022-10-04 ENCOUNTER — Other Ambulatory Visit: Payer: Self-pay | Admitting: Family Medicine

## 2022-10-04 NOTE — Telephone Encounter (Signed)
Requested Prescriptions  Pending Prescriptions Disp Refills   nortriptyline (PAMELOR) 50 MG capsule [Pharmacy Med Name: NORTRIPTYLINE HCL 50 MG CAP] 30 capsule 2    Sig: TAKE 1 CAPSULE BY MOUTH AT BEDTIME.     Psychiatry:  Antidepressants - Heterocyclics (TCAs) Passed - 10/04/2022  1:08 AM      Passed - Valid encounter within last 6 months    Recent Outpatient Visits           2 months ago Acute nonintractable headache, unspecified headache type   [facility] [place], Megan P, DO   4 months ago Neuropathy   [facility] [place], [place], DO   6 months ago Primary hypertension   [facility] [place], [place] P, DO   1 year ago Routine general medical examination at a health care facility   [facility] [place], [place] P, DO   1 year ago Abdominal pain, unspecified abdominal location   [facility] Dorcas Carrow, DO       Future Appointments             In 5 days Dorcas Carrow, DO [facility]

## 2022-10-09 ENCOUNTER — Ambulatory Visit (INDEPENDENT_AMBULATORY_CARE_PROVIDER_SITE_OTHER): Payer: 59 | Admitting: Family Medicine

## 2022-10-09 ENCOUNTER — Encounter: Payer: Self-pay | Admitting: Family Medicine

## 2022-10-09 VITALS — BP 123/77 | HR 90 | Temp 97.8°F | Ht 67.75 in | Wt 226.7 lb

## 2022-10-09 DIAGNOSIS — Z0000 Encounter for general adult medical examination without abnormal findings: Secondary | ICD-10-CM | POA: Diagnosis not present

## 2022-10-09 DIAGNOSIS — G629 Polyneuropathy, unspecified: Secondary | ICD-10-CM | POA: Diagnosis not present

## 2022-10-09 DIAGNOSIS — I10 Essential (primary) hypertension: Secondary | ICD-10-CM

## 2022-10-09 DIAGNOSIS — Z8709 Personal history of other diseases of the respiratory system: Secondary | ICD-10-CM

## 2022-10-09 DIAGNOSIS — Z1211 Encounter for screening for malignant neoplasm of colon: Secondary | ICD-10-CM | POA: Diagnosis not present

## 2022-10-09 LAB — URINALYSIS, ROUTINE W REFLEX MICROSCOPIC
Bilirubin, UA: NEGATIVE
Glucose, UA: NEGATIVE
Ketones, UA: NEGATIVE
Leukocytes,UA: NEGATIVE
Nitrite, UA: NEGATIVE
Protein,UA: NEGATIVE
RBC, UA: NEGATIVE
Specific Gravity, UA: 1.01 (ref 1.005–1.030)
Urobilinogen, Ur: 0.2 mg/dL (ref 0.2–1.0)
pH, UA: 5.5 (ref 5.0–7.5)

## 2022-10-09 LAB — MICROALBUMIN, URINE WAIVED
Creatinine, Urine Waived: 50 mg/dL (ref 10–300)
Microalb, Ur Waived: 10 mg/L (ref 0–19)

## 2022-10-09 MED ORDER — NORTRIPTYLINE HCL 50 MG PO CAPS: 50.0000 mg | ORAL_CAPSULE | Freq: Every day | ORAL | 1 refills | Status: DC

## 2022-10-09 MED ORDER — LISINOPRIL 20 MG PO TABS: ORAL_TABLET | ORAL | 1 refills | Status: DC

## 2022-10-09 MED ORDER — ALBUTEROL SULFATE HFA 108 (90 BASE) MCG/ACT IN AERS: 1.0000 | INHALATION_SPRAY | Freq: Four times a day (QID) | RESPIRATORY_TRACT | 3 refills | Status: DC | PRN

## 2022-10-09 MED ORDER — ESOMEPRAZOLE MAGNESIUM 20 MG PO CPDR
20.0000 mg | DELAYED_RELEASE_CAPSULE | Freq: Every day | ORAL | 3 refills | Status: DC
Start: 2022-10-09 — End: 2023-10-18

## 2022-10-09 NOTE — Progress Notes (Signed)
BP 123/77   Pulse 90   Temp 97.8 F (36.6 C) (Oral)   Ht 5' 7.75" (1.721 m)   Wt 226 lb 11.2 oz (102.8 kg)   SpO2 96%   BMI 34.72 kg/m    Subjective:    Patient ID: Douglas Murillo, male    DOB: 21-May-1961, 62 y.o.   MRN: [id number]  HPI: Douglas Murillo is a 62 y.o. male presenting on 10/09/2022 for comprehensive medical examination. Current medical complaints include:  HYPERTENSION  Hypertension status: controlled  Satisfied with current treatment? yes Duration of hypertension: chronic BP monitoring frequency:  not checking BP range:  BP medication side effects:  no Medication compliance: excellent compliance Previous BP meds: lisinopril Aspirin: no Recurrent headaches: no Visual changes: no Palpitations: no Dyspnea: no Chest pain: no Lower extremity edema: no Dizzy/lightheaded: no  GERD GERD control status: controlled Satisfied with current treatment? yes Medication side effects: no  Medication compliance: excellent Previous GERD medications: nexium Dysphagia: no Odynophagia:  no Hematemesis: no Blood in stool: no EGD: no  NEUROPATHY Neuropathy status: controlled  Satisfied with current treatment?: yes Medication side effects: no Medication compliance:  excellent compliance Location: feet Pain: no Severity: mild  Quality:  numb and tingling Frequency: intermittent Bilateral: yes Symmetric: yes Numbness: yes Decreased sensation: yes Weakness: no Context: stable  He currently lives with: wife Interim Problems from his last visit: no  Depression Screen done today and results listed below:     07/07/2022    8:52 AM 06/15/2021    9:44 AM 09/27/2020    2:59 PM 03/29/2020    4:22 PM 02/14/2019    3:24 PM  Depression screen PHQ 2/9  Decreased Interest 2 0 0 0 0  Down, Depressed, Hopeless 0 0 0 0 0  PHQ - 2 Score 2 0 0 0 0  Altered sleeping 1 0  0   Tired, decreased energy 3 0  0   Change in appetite 1 0  0   Feeling bad or failure  about yourself  0 0  0   Trouble concentrating 0 0  0   Moving slowly or fidgety/restless 0 0  0   Suicidal thoughts 0 0  0   PHQ-9 Score 7 0  0   Difficult doing work/chores Not difficult at all   Not difficult at all    Past Medical History:  Past Medical History:  Diagnosis Date   GERD (gastroesophageal reflux disease)    Hypertension     Surgical History:  Past Surgical History:  Procedure Laterality Date   ANAL FISSURE REPAIR     CHOLECYSTECTOMY      Medications:  Current Outpatient Medications on File Prior to Visit  Medication Sig   Multiple Vitamins-Minerals (MULTIVITAMIN PO) Take by mouth daily.   No current facility-administered medications on file prior to visit.    Allergies:  Allergies  Allergen Reactions   Prednisone Other (See Comments)    Joint Pain   Hctz [Hydrochlorothiazide] Other (See Comments)    Headache    Social History:  Social History   Socioeconomic History   Marital status: Married    Spouse name: Not on file   Number of children: Not on file   Years of education: Not on file   Highest education level: Not on file  Occupational History   Not on file  Tobacco Use   Smoking status: Former    Types: Cigarettes    Quit date: 12/20/2015  Years since quitting: 6.8   Smokeless tobacco: Never  Vaping Use   Vaping Use: Former  Substance and Sexual Activity   Alcohol use: No   Drug use: Yes    Types: Marijuana   Sexual activity: Yes  Other Topics Concern   Not on file  Social History Narrative   Not on file   Social Determinants of Health   Financial Resource Strain: Not on file  Food Insecurity: Not on file  Transportation Needs: Not on file  Physical Activity: Not on file  Stress: Not on file  Social Connections: Not on file  Intimate Partner Violence: Not on file   Social History   Tobacco Use  Smoking Status Former   Types: Cigarettes   Quit date: 12/20/2015   Years since quitting: 6.8  Smokeless Tobacco Never    Social History   Substance and Sexual Activity  Alcohol Use No    Family History:  Family History  Problem Relation Age of Onset   Arthritis Mother    Cancer Mother        breast   Cancer Father        lymphoma   Diabetes Father    Hearing loss Father    Heart disease Father    Hypertension Father    Kidney disease Father    Stroke Maternal Grandmother    Arthritis Maternal Grandmother    Birth defects Maternal Grandfather    Arthritis Paternal Grandmother    Heart disease Paternal Caprice Renshaw' disease Daughter    Fibromyalgia Sister    Heart disease Brother    Cancer Brother     Past medical history, surgical history, medications, allergies, family history and social history reviewed with patient today and changes made to appropriate areas of the chart.   Review of Systems  Constitutional: Negative.   HENT: Negative.    Eyes: Negative.   Respiratory: Negative.    Cardiovascular:  Positive for leg swelling. Negative for chest pain, palpitations, orthopnea, claudication and PND.  Gastrointestinal: Negative.   Genitourinary: Negative.   Musculoskeletal: Negative.   Skin: Negative.   Neurological: Negative.   Endo/Heme/Allergies: Negative.   Psychiatric/Behavioral: Negative.     All other ROS negative except what is listed above and in the HPI.      Objective:    BP 123/77   Pulse 90   Temp 97.8 F (36.6 C) (Oral)   Ht 5' 7.75" (1.721 m)   Wt 226 lb 11.2 oz (102.8 kg)   SpO2 96%   BMI 34.72 kg/m   Wt Readings from Last 3 Encounters:  10/09/22 226 lb 11.2 oz (102.8 kg)  05/08/22 227 lb 6.4 oz (103.1 kg)  04/07/22 225 lb 4.8 oz (102.2 kg)    Physical Exam Vitals and nursing note reviewed.  Constitutional:      General: He is not in acute distress.    Appearance: Normal appearance. He is obese. He is not ill-appearing, toxic-appearing or diaphoretic.  HENT:     Head: Normocephalic and atraumatic.     Right Ear: Tympanic membrane, ear  canal and external ear normal. There is no impacted cerumen.     Left Ear: Tympanic membrane, ear canal and external ear normal. There is no impacted cerumen.     Nose: Nose normal. No congestion or rhinorrhea.     Mouth/Throat:     Mouth: Mucous membranes are moist.     Pharynx: Oropharynx is clear. No oropharyngeal exudate or posterior oropharyngeal  erythema.  Eyes:     General: No scleral icterus.       Right eye: No discharge.        Left eye: No discharge.     Extraocular Movements: Extraocular movements intact.     Conjunctiva/sclera: Conjunctivae normal.     Pupils: Pupils are equal, round, and reactive to light.  Neck:     Vascular: No carotid bruit.  Cardiovascular:     Rate and Rhythm: Normal rate and regular rhythm.     Pulses: Normal pulses.     Heart sounds: No murmur heard.    No friction rub. No gallop.  Pulmonary:     Effort: Pulmonary effort is normal. No respiratory distress.     Breath sounds: Normal breath sounds. No stridor. No wheezing, rhonchi or rales.  Chest:     Chest wall: No tenderness.  Abdominal:     General: Abdomen is flat. Bowel sounds are normal. There is no distension.     Palpations: Abdomen is soft. There is no mass.     Tenderness: There is no abdominal tenderness. There is no right CVA tenderness, left CVA tenderness, guarding or rebound.     Hernia: No hernia is present.  Genitourinary:    Comments: Genital exam deferred with shared decision making Musculoskeletal:        General: No swelling, tenderness, deformity or signs of injury.     Cervical back: Normal range of motion and neck supple. No rigidity. No muscular tenderness.     Right lower leg: No edema.     Left lower leg: No edema.  Lymphadenopathy:     Cervical: No cervical adenopathy.  Skin:    General: Skin is warm and dry.     Capillary Refill: Capillary refill takes less than 2 seconds.     Coloration: Skin is not jaundiced or pale.     Findings: No bruising, erythema,  lesion or rash.  Neurological:     General: No focal deficit present.     Mental Status: He is alert and oriented to person, place, and time.     Cranial Nerves: No cranial nerve deficit.     Sensory: No sensory deficit.     Motor: No weakness.     Coordination: Coordination normal.     Gait: Gait normal.     Deep Tendon Reflexes: Reflexes normal.  Psychiatric:        Mood and Affect: Mood normal.        Behavior: Behavior normal.        Thought Content: Thought content normal.        Judgment: Judgment normal.     Results for orders placed or performed in visit on 04/07/22  Basic metabolic panel  Result Value Ref Range   Glucose 129 (H) 70 - 99 mg/dL   BUN 17 8 - 27 mg/dL   Creatinine, Ser 1.61 (H) 0.76 - 1.27 mg/dL   eGFR 61 >09 UE/AVW/0.98   BUN/Creatinine Ratio 13 10 - 24   Sodium 141 134 - 144 mmol/L   Potassium 4.8 3.5 - 5.2 mmol/L   Chloride 102 96 - 106 mmol/L   CO2 26 20 - 29 mmol/L   Calcium 9.7 8.6 - 10.2 mg/dL      Assessment & Plan:   Problem List Items Addressed This Visit       Cardiovascular and Mediastinum   HTN (hypertension)    Under good control on current regimen. Continue current regimen. Continue  to monitor. Call with any concerns. Refills given. Labs drawn today.        Relevant Medications   lisinopril (ZESTRIL) 20 MG tablet   Other Relevant Orders   Microalbumin, Urine Waived     Nervous and Auditory   Neuropathy    Under good control on current regimen. Continue current regimen. Continue to monitor. Call with any concerns. Refills given. Labs drawn today.       Other Visit Diagnoses     Routine general medical examination at a health care facility    -  Primary   Vaccines up to date/declined. Screening labs checked today. Colguard ordered. Continue diet and exercise. Call with any concerns.   Relevant Orders   Comprehensive metabolic panel   CBC with Differential/Platelet   Lipid Panel w/o Chol/HDL Ratio   PSA   TSH    Urinalysis, Routine w reflex microscopic   Screening for colon cancer       Cologuard ordered today.   Relevant Orders   Cologuard   History of bronchitis       Relevant Medications   albuterol (VENTOLIN HFA) 108 (90 Base) MCG/ACT inhaler        LABORATORY TESTING:  Health maintenance labs ordered today as discussed above.   The natural history of prostate cancer and ongoing controversy regarding screening and potential treatment outcomes of prostate cancer has been discussed with the patient. The meaning of a false positive PSA and a false negative PSA has been discussed. He indicates understanding of the limitations of this screening test and wishes to proceed with screening PSA testing.   IMMUNIZATIONS:   - Tdap: Tetanus vaccination status reviewed: last tetanus booster within 10 years. - Influenza: Postponed to flu season - Pneumovax: Not applicable - Prevnar: Not applicable - COVID: Refused - HPV: Not applicable - Shingrix vaccine: Refused  SCREENING: - Colonoscopy: cologuard ordered today  Discussed with patient purpose of the colonoscopy is to detect colon cancer at curable precancerous or early stages   PATIENT COUNSELING:    Sexuality: Discussed sexually transmitted diseases, partner selection, use of condoms, avoidance of unintended pregnancy  and contraceptive alternatives.   Advised to avoid cigarette smoking.  I discussed with the patient that most people either abstain from alcohol or drink within safe limits (<=14/week and <=4 drinks/occasion for males, <=7/weeks and <= 3 drinks/occasion for females) and that the risk for alcohol disorders and other health effects rises proportionally with the number of drinks per week and how often a drinker exceeds daily limits.  Discussed cessation/primary prevention of drug use and availability of treatment for abuse.   Diet: Encouraged to adjust caloric intake to maintain  or achieve ideal body weight, to reduce intake of  dietary saturated fat and total fat, to limit sodium intake by avoiding high sodium foods and not adding table salt, and to maintain adequate dietary potassium and calcium preferably from fresh fruits, vegetables, and low-fat dairy products.    stressed the importance of regular exercise  Injury prevention: Discussed safety belts, safety helmets, smoke detector, smoking near bedding or upholstery.   Dental health: Discussed importance of regular tooth brushing, flossing, and dental visits.   Follow up plan: NEXT PREVENTATIVE PHYSICAL DUE IN 1 YEAR. Return in about 6 months (around 04/11/2023).

## 2022-10-09 NOTE — Assessment & Plan Note (Signed)
Under good control on current regimen. Continue current regimen. Continue to monitor. Call with any concerns. Refills given. Labs drawn today.   

## 2022-10-10 LAB — COMPREHENSIVE METABOLIC PANEL
ALT: 19 IU/L (ref 0–44)
AST: 20 IU/L (ref 0–40)
Albumin/Globulin Ratio: 1.4 (ref 1.2–2.2)
Albumin: 4 g/dL (ref 3.9–4.9)
Alkaline Phosphatase: 87 IU/L (ref 44–121)
BUN/Creatinine Ratio: 15 (ref 10–24)
BUN: 21 mg/dL (ref 8–27)
Bilirubin Total: 0.4 mg/dL (ref 0.0–1.2)
CO2: 22 mmol/L (ref 20–29)
Calcium: 9.4 mg/dL (ref 8.6–10.2)
Chloride: 102 mmol/L (ref 96–106)
Creatinine, Ser: 1.39 mg/dL — ABNORMAL HIGH (ref 0.76–1.27)
Globulin, Total: 2.8 g/dL (ref 1.5–4.5)
Glucose: 79 mg/dL (ref 70–99)
Potassium: 4.7 mmol/L (ref 3.5–5.2)
Sodium: 137 mmol/L (ref 134–144)
Total Protein: 6.8 g/dL (ref 6.0–8.5)
eGFR: 58 mL/min/{1.73_m2} — ABNORMAL LOW (ref >59)

## 2022-10-10 LAB — CBC WITH DIFFERENTIAL/PLATELET
Basophils Absolute: 0.1 10*3/uL (ref 0.0–0.2)
Basos: 1 %
EOS (ABSOLUTE): 0.3 10*3/uL (ref 0.0–0.4)
Eos: 3 %
Hematocrit: 46.5 % (ref 37.5–51.0)
Hemoglobin: 15.8 g/dL (ref 13.0–17.7)
Immature Grans (Abs): 0.1 10*3/uL (ref 0.0–0.1)
Immature Granulocytes: 1 %
Lymphocytes Absolute: 3.5 10*3/uL — ABNORMAL HIGH (ref 0.7–3.1)
Lymphs: 29 %
MCH: 32 pg (ref 26.6–33.0)
MCHC: 34 g/dL (ref 31.5–35.7)
MCV: 94 fL (ref 79–97)
Monocytes Absolute: 1 10*3/uL — ABNORMAL HIGH (ref 0.1–0.9)
Monocytes: 8 %
Neutrophils Absolute: 7.1 10*3/uL — ABNORMAL HIGH (ref 1.4–7.0)
Neutrophils: 58 %
Platelets: 261 10*3/uL (ref 150–450)
RBC: 4.93 x10E6/uL (ref 4.14–5.80)
RDW: 13 % (ref 11.6–15.4)
WBC: 12.1 10*3/uL — ABNORMAL HIGH (ref 3.4–10.8)

## 2022-10-10 LAB — LIPID PANEL W/O CHOL/HDL RATIO
Cholesterol, Total: 183 mg/dL (ref 100–199)
HDL: 46 mg/dL (ref >39)
LDL Chol Calc (NIH): 93 mg/dL (ref 0–99)
Triglycerides: 261 mg/dL — ABNORMAL HIGH (ref 0–149)
VLDL Cholesterol Cal: 44 mg/dL — ABNORMAL HIGH (ref 5–40)

## 2022-10-10 LAB — TSH: TSH: 1.42 u[IU]/mL (ref 0.450–4.500)

## 2022-10-10 LAB — PSA: Prostate Specific Ag, Serum: 4.7 ng/mL — ABNORMAL HIGH (ref 0.0–4.0)

## 2022-10-12 ENCOUNTER — Other Ambulatory Visit: Payer: Self-pay | Admitting: Family Medicine

## 2022-10-12 DIAGNOSIS — R972 Elevated prostate specific antigen [PSA]: Secondary | ICD-10-CM

## 2022-11-30 ENCOUNTER — Encounter: Payer: Self-pay | Admitting: Urology

## 2022-11-30 ENCOUNTER — Ambulatory Visit: Payer: 59 | Admitting: Urology

## 2022-11-30 VITALS — BP 147/84 | HR 92 | Ht 68.0 in | Wt 222.0 lb

## 2022-11-30 DIAGNOSIS — R972 Elevated prostate specific antigen [PSA]: Secondary | ICD-10-CM

## 2022-11-30 LAB — URINALYSIS, COMPLETE
Bilirubin, UA: NEGATIVE
Glucose, UA: NEGATIVE
Ketones, UA: NEGATIVE
Leukocytes,UA: NEGATIVE
Nitrite, UA: NEGATIVE
Protein,UA: NEGATIVE
RBC, UA: NEGATIVE
Specific Gravity, UA: 1.015 (ref 1.005–1.030)
Urobilinogen, Ur: 0.2 mg/dL (ref 0.2–1.0)
pH, UA: 7 (ref 5.0–7.5)

## 2022-11-30 LAB — MICROSCOPIC EXAMINATION: Bacteria, UA: NONE SEEN

## 2022-11-30 NOTE — Progress Notes (Signed)
Alcus Dad presents for an office visit. BP today is _147/84__________. He is complaint with BP medication. Greater than 140/90. Provider  notified. Pt advised to__f/u with PCP____________. Pt voiced understanding.

## 2022-11-30 NOTE — Progress Notes (Signed)
Marcelle Overlie Plume,acting as a scribe for Riki Altes, MD.,have documented all relevant documentation on the behalf of Riki Altes, MD,as directed by  Riki Altes, MD while in the presence of Riki Altes, MD.  11/30/2022 2:59 PM   Douglas Murillo Jan 10, 1961 [id number]  Referring provider: Dorcas Carrow, DO [address] [place],  [place] [address]  Chief Complaint  Patient presents with   New Patient (Initial Visit)   Elevated PSA    HPI: Douglas Murillo is a 62 y.o. male who is referred for evaluation of an elevated PSA.  PSA level in the mid 2 range in 2018-2019 and increased to the upper 3-4.0 in 2020 and 2022. His last 3 PSA's were 5.6 in 10/2021; 4.4 in 11/2021; 4.7 in 10/2022 Urinalysis on 10/09/2022 was unremarkable No prior history of urologic problems or previous urologic evaluation Denies bothersome lower urinary symptoms Family history of prostate cancer; brother diagnosed at 31 and was treated with HIFU    Prostate Specific Ag, Serum  Latest Ref Rng 0.0 - 4.0 ng/mL  02/14/2019 4.0   09/27/2020 3.9   10/03/2021 5.6 (H)   11/14/2021 4.4 (H)   10/09/2022 4.7 (H)      PMH: Past Medical History:  Diagnosis Date   GERD (gastroesophageal reflux disease)    Hypertension     Surgical History: Past Surgical History:  Procedure Laterality Date   ANAL FISSURE REPAIR     CHOLECYSTECTOMY      Home Medications:  Allergies as of 11/30/2022       Reactions   Prednisone Other (See Comments)   Joint Pain   Hctz [hydrochlorothiazide] Other (See Comments)   Headache        Medication List        Accurate as of November 30, 2022  2:59 PM. If you have any questions, ask your nurse or doctor.          albuterol 108 (90 Base) MCG/ACT inhaler Commonly known as: VENTOLIN HFA Inhale 1-2 puffs into the lungs every 6 (six) hours as needed.   esomeprazole 20 MG capsule Commonly known as: NEXIUM Take 1 capsule (20 mg total) by mouth daily at 12 noon.    lisinopril 20 MG tablet Commonly known as: ZESTRIL TAKE 1 TABLET(20 MG) BY MOUTH DAILY   MULTIVITAMIN PO Take by mouth daily.   nortriptyline 50 MG capsule Commonly known as: PAMELOR Take 1 capsule (50 mg total) by mouth at bedtime.        Allergies:  Allergies  Allergen Reactions   Prednisone Other (See Comments)    Joint Pain   Hctz [Hydrochlorothiazide] Other (See Comments)    Headache    Family History: Family History  Problem Relation Age of Onset   Arthritis Mother    Cancer Mother        breast   Cancer Father        lymphoma   Diabetes Father    Hearing loss Father    Heart disease Father    Hypertension Father    Kidney disease Father    Stroke Maternal Grandmother    Arthritis Maternal Grandmother    Birth defects Maternal Grandfather    Arthritis Paternal Grandmother    Heart disease Paternal Caprice Renshaw' disease Daughter    Fibromyalgia Sister    Heart disease Brother    Cancer Brother     Social History:  reports that he quit smoking about  6 years ago. His smoking use included cigarettes. He has never used smokeless tobacco. He reports current drug use. Drug: Marijuana. He reports that he does not drink alcohol.   Physical Exam: BP (!) 155/84   Pulse 86   Ht 5\' 8"  (1.727 m)   Wt 222 lb (100.7 kg)   BMI 33.75 kg/m   Constitutional:  Alert and oriented, No acute distress. HEENT: [place] AT, moist mucus membranes.  Trachea midline, no masses. Cardiovascular: No clubbing, cyanosis, or edema. Respiratory: Normal respiratory effort, no increased work of breathing. GI: Abdomen is soft, nontender, nondistended, no abdominal masses GU: Prostate 30 grams, smooth with subtle firmness left side Skin: No rashes, bruises or suspicious lesions. Neurologic: Grossly intact, no focal deficits, moving all 4 extremities. Psychiatric: Normal mood and affect.   Assessment & Plan:    1. Elevated PSA Although PSA is a prostate cancer screening test  he was informed that cancer is not the most common cause of an elevated PSA. Other potential causes including BPH and inflammation were discussed. He was informed that the only way to adequately diagnose prostate cancer would be a transrectal ultrasound and biopsy of the prostate. The procedure was discussed including potential risks of bleeding and infection/sepsis. He was also informed that a negative biopsy does not conclusively rule out the possibility that prostate cancer may be present and that continued monitoring is required. The use of newer adjunctive blood tests including PHI and 4kScore were discussed. The use of multiparametric prostate MRI to evaluate for lesions suspicious for high grade cancer and aid in targeted biopsy was reviewed. Continued periodic surveillance was also discussed. He has elected to proceed with prostate MRI and we will call with the results.   I have reviewed the above documentation for accuracy and completeness, and I agree with the above.   Riki Altes, MD  [facility] [address] [place], [place] [address] [phone]

## 2022-12-04 ENCOUNTER — Other Ambulatory Visit: Payer: Self-pay | Admitting: Physician Assistant

## 2022-12-04 ENCOUNTER — Encounter: Payer: Self-pay | Admitting: Urology

## 2022-12-04 DIAGNOSIS — R972 Elevated prostate specific antigen [PSA]: Secondary | ICD-10-CM

## 2022-12-04 MED ORDER — DIAZEPAM 2 MG PO TABS
ORAL_TABLET | ORAL | 0 refills | Status: DC
Start: 2022-12-04 — End: 2023-03-28

## 2022-12-14 ENCOUNTER — Ambulatory Visit
Admission: RE | Admit: 2022-12-14 | Discharge: 2022-12-14 | Disposition: A | Discharge status: Home / Self Care | Payer: 59 | Source: Ambulatory Visit | Attending: Urology | Admitting: Urology

## 2022-12-14 DIAGNOSIS — N4289 Other specified disorders of prostate: Secondary | ICD-10-CM | POA: Diagnosis not present

## 2022-12-14 DIAGNOSIS — R972 Elevated prostate specific antigen [PSA]: Secondary | ICD-10-CM | POA: Diagnosis not present

## 2022-12-14 MED ORDER — GADOBUTROL 1 MMOL/ML IV SOLN
10.0000 mL | Freq: Once | INTRAVENOUS | Status: AC | PRN
  Administered 2022-12-14: 10 mL via INTRAVENOUS

## 2022-12-22 ENCOUNTER — Encounter: Payer: Self-pay | Admitting: *Deleted

## 2022-12-27 ENCOUNTER — Encounter: Payer: Self-pay | Admitting: *Deleted

## 2023-03-28 ENCOUNTER — Encounter: Payer: Self-pay | Admitting: Urology

## 2023-03-28 ENCOUNTER — Ambulatory Visit: Payer: 59 | Admitting: Urology

## 2023-03-28 VITALS — BP 174/92 | HR 85 | Ht 68.0 in | Wt 220.0 lb

## 2023-03-28 DIAGNOSIS — Z2989 Encounter for other specified prophylactic measures: Secondary | ICD-10-CM | POA: Diagnosis not present

## 2023-03-28 DIAGNOSIS — C61 Malignant neoplasm of prostate: Secondary | ICD-10-CM | POA: Diagnosis not present

## 2023-03-28 DIAGNOSIS — N4232 Atypical small acinar proliferation of prostate: Secondary | ICD-10-CM | POA: Diagnosis not present

## 2023-03-28 DIAGNOSIS — R972 Elevated prostate specific antigen [PSA]: Secondary | ICD-10-CM

## 2023-03-28 MED ORDER — LEVOFLOXACIN 500 MG PO TABS
500.0000 mg | ORAL_TABLET | Freq: Once | ORAL | Status: AC
Start: 2023-03-28 — End: 2023-03-28
  Administered 2023-03-28: 500 mg via ORAL

## 2023-03-28 MED ORDER — GENTAMICIN SULFATE 40 MG/ML IJ SOLN
80.0000 mg | Freq: Once | INTRAMUSCULAR | Status: AC
Start: 2023-03-28 — End: 2023-03-28
  Administered 2023-03-28: 80 mg via INTRAMUSCULAR

## 2023-03-28 NOTE — Progress Notes (Signed)
   03/28/23  Indication: Elevated PSA 4.7, PI-RADS 5 lesion on MRI  MRI Fusion Prostate Biopsy Procedure   Informed consent was obtained, and we discussed the risks of bleeding and infection/sepsis. A time out was performed to ensure correct patient identity.  Pre-Procedure: - Last PSA Level: 4.7 - Gentamicin and levaquin given for antibiotic prophylaxis -Prostate measured 21 g on MRI, PSA density 0.22 -PI-RADS 5 lesion correlating with hypoechoic region on ultrasound  Procedure: - Prostate block performed using 10 cc 1% lidocaine  - MRI fusion biopsy was performed, and 3 biopsies were taken from the ROI#1 PIRADS 5 lesion located left peripheral zone - Standard biopsies taken from sextant areas, 12 under ultrasound guidance. - Total of 15 cores taken  Post-Procedure: - Patient tolerated the procedure well - He was counseled to seek immediate medical attention if experiences significant bleeding, fevers, or severe pain - Return in one week to discuss biopsy results  Assessment/ Plan: Will follow up in 1-2 weeks to discuss pathology with Dr. Mort Sawyers, MD 03/28/2023

## 2023-03-28 NOTE — Patient Instructions (Signed)

## 2023-04-04 ENCOUNTER — Telehealth: Payer: Self-pay | Admitting: *Deleted

## 2023-04-04 ENCOUNTER — Telehealth: Payer: Self-pay

## 2023-04-04 NOTE — Telephone Encounter (Signed)
-----   Message from Riki Altes sent at 04/04/2023  9:18 AM EDT ----- Regarding: Follow-up visit Please schedule follow-up visit for prostate biopsy results.  Video or in person is fine

## 2023-04-04 NOTE — Telephone Encounter (Signed)
Left message for patient to call back and schedule appt

## 2023-04-05 ENCOUNTER — Other Ambulatory Visit: Payer: Self-pay | Admitting: Family Medicine

## 2023-04-05 NOTE — Telephone Encounter (Signed)
Requested Prescriptions  Pending Prescriptions Disp Refills   lisinopril (ZESTRIL) 20 MG tablet [Pharmacy Med Name: LISINOPRIL 20 MG TABLET] 90 tablet 0    Sig: TAKE 1 TABLET BY MOUTH EVERY DAY     Cardiovascular:  ACE Inhibitors Failed - 04/05/2023  1:30 AM      Failed - Cr in normal range and within 180 days    Creatinine, Ser  Date Value Ref Range Status  10/09/2022 1.39 (H) 0.76 - 1.27 mg/dL Final         Failed - Last BP in normal range    BP Readings from Last 1 Encounters:  03/28/23 (!) 174/92         Passed - K in normal range and within 180 days    Potassium  Date Value Ref Range Status  10/09/2022 4.7 3.5 - 5.2 mmol/L Final         Passed - Patient is not pregnant      Passed - Valid encounter within last 6 months    Recent Outpatient Visits           5 months ago Routine general medical examination at a health care facility   [facility], Megan P, DO   9 months ago Acute nonintractable headache, unspecified headache type   [place] [facility] [place], Megan P, DO   11 months ago Neuropathy   [facility] [place], [place], DO   12 months ago Primary hypertension   [facility] [place], Megan P, DO   1 year ago Routine general medical examination at a health care facility   [facility] [place], Oralia Rud, DO       Future Appointments             In 1 week Laural Benes, Oralia Rud, DO [place] [facility]   In 2 weeks Lonna Cobb, Verna Czech, MD [facility] [place]

## 2023-04-12 ENCOUNTER — Encounter: Payer: Self-pay | Admitting: Family Medicine

## 2023-04-12 ENCOUNTER — Ambulatory Visit (INDEPENDENT_AMBULATORY_CARE_PROVIDER_SITE_OTHER): Payer: 59 | Admitting: Family Medicine

## 2023-04-12 VITALS — BP 138/74 | HR 84 | Ht 68.0 in | Wt 237.0 lb

## 2023-04-12 DIAGNOSIS — R972 Elevated prostate specific antigen [PSA]: Secondary | ICD-10-CM

## 2023-04-12 DIAGNOSIS — I10 Essential (primary) hypertension: Secondary | ICD-10-CM | POA: Diagnosis not present

## 2023-04-12 DIAGNOSIS — G629 Polyneuropathy, unspecified: Secondary | ICD-10-CM | POA: Diagnosis not present

## 2023-04-12 MED ORDER — LISINOPRIL 20 MG PO TABS: ORAL_TABLET | ORAL | 1 refills | Status: DC

## 2023-04-12 MED ORDER — NORTRIPTYLINE HCL 50 MG PO CAPS: 50.0000 mg | ORAL_CAPSULE | Freq: Every day | ORAL | 1 refills | Status: DC

## 2023-04-12 NOTE — Assessment & Plan Note (Signed)
Under good control on current regimen. Continue current regimen. Continue to monitor. Call with any concerns. Refills given. Labs drawn today.   

## 2023-04-12 NOTE — Progress Notes (Signed)
BP 138/74   Pulse 84   Ht 5\' 8"  (1.727 m)   Wt 237 lb (107.5 kg)   SpO2 96%   BMI 36.04 kg/m    Subjective:    Patient ID: Douglas Murillo, male    DOB: [birth year], 62 y.o.   MRN: [id number]  HPI: Douglas Murillo is a 62 y.o. male  Chief Complaint  Patient presents with   Hypertension   Elevated PSA   HYPERTENSION  Hypertension status: controlled  Satisfied with current treatment? yes Duration of hypertension: chronic BP monitoring frequency:  not checking BP medication side effects:  no Medication compliance: excellent compliance Previous BP meds:lisinopril Aspirin: no Recurrent headaches: no Visual changes: no Palpitations: no Dyspnea: no Chest pain: no Lower extremity edema: no Dizzy/lightheaded: no  NEUROPATHY Neuropathy status: controlled  Satisfied with current treatment?: yes Medication side effects: no Medication compliance:  excellent compliance Location: feet Pain: no Severity: mild  Quality:  numb and tingling Frequency: constant Bilateral: yes Symmetric: yes Numbness: yes Decreased sensation: yes Weakness: no Context: stable   Relevant past medical, surgical, family and social history reviewed and updated as indicated. Interim medical history since our last visit reviewed. Allergies and medications reviewed and updated.  Review of Systems  Constitutional: Negative.   Respiratory: Negative.    Cardiovascular: Negative.   Genitourinary: Negative.   Musculoskeletal: Negative.   Psychiatric/Behavioral: Negative.      Per HPI unless specifically indicated above     Objective:    BP 138/74   Pulse 84   Ht 5\' 8"  (1.727 m)   Wt 237 lb (107.5 kg)   SpO2 96%   BMI 36.04 kg/m   Wt Readings from Last 3 Encounters:  04/12/23 237 lb (107.5 kg)  03/28/23 220 lb (99.8 kg)  11/30/22 222 lb (100.7 kg)    Physical Exam Vitals and nursing note reviewed.  Constitutional:      General: He is not in acute distress.     Appearance: Normal appearance. He is not ill-appearing, toxic-appearing or diaphoretic.  HENT:     Head: Normocephalic and atraumatic.     Right Ear: External ear normal.     Left Ear: External ear normal.     Nose: Nose normal.     Mouth/Throat:     Mouth: Mucous membranes are moist.     Pharynx: Oropharynx is clear.  Eyes:     General: No scleral icterus.       Right eye: No discharge.        Left eye: No discharge.     Extraocular Movements: Extraocular movements intact.     Conjunctiva/sclera: Conjunctivae normal.     Pupils: Pupils are equal, round, and reactive to light.  Cardiovascular:     Rate and Rhythm: Normal rate and regular rhythm.     Pulses: Normal pulses.     Heart sounds: Normal heart sounds. No murmur heard.    No friction rub. No gallop.  Pulmonary:     Effort: Pulmonary effort is normal. No respiratory distress.     Breath sounds: Normal breath sounds. No stridor. No wheezing, rhonchi or rales.  Chest:     Chest wall: No tenderness.  Musculoskeletal:        General: Normal range of motion.     Cervical back: Normal range of motion and neck supple.  Skin:    General: Skin is warm and dry.     Capillary Refill: Capillary refill takes less than 2 seconds.  Coloration: Skin is not jaundiced or pale.     Findings: No bruising, erythema, lesion or rash.  Neurological:     General: No focal deficit present.     Mental Status: He is alert and oriented to person, place, and time. Mental status is at baseline.  Psychiatric:        Mood and Affect: Mood normal.        Behavior: Behavior normal.        Thought Content: Thought content normal.        Judgment: Judgment normal.     Results for orders placed or performed in visit on 11/30/22  Microscopic Examination   Urine  Result Value Ref Range   WBC, UA 0-5 0 - 5 /hpf   RBC, Urine 0-2 0 - 2 /hpf   Epithelial Cells (non renal) 0-10 0 - 10 /hpf   Bacteria, UA None seen None seen/Few  Urinalysis,  Complete  Result Value Ref Range   Specific Gravity, UA 1.015 1.005 - 1.030   pH, UA 7.0 5.0 - 7.5   Color, UA Yellow Yellow   Appearance Ur Clear Clear   Leukocytes,UA Negative Negative   Protein,UA Negative Negative/Trace   Glucose, UA Negative Negative   Ketones, UA Negative Negative   RBC, UA Negative Negative   Bilirubin, UA Negative Negative   Urobilinogen, Ur 0.2 0.2 - 1.0 mg/dL   Nitrite, UA Negative Negative   Microscopic Examination See below:       Assessment & Plan:   Problem List Items Addressed This Visit       Cardiovascular and Mediastinum   HTN (hypertension) - Primary    Under good control on current regimen. Continue current regimen. Continue to monitor. Call with any concerns. Refills given. Labs drawn today.      Relevant Medications   lisinopril (ZESTRIL) 20 MG tablet   Other Relevant Orders   CBC with Differential/Platelet   Basic metabolic panel     Nervous and Auditory   Neuropathy    Under good control on current regimen. Continue current regimen. Continue to monitor. Call with any concerns. Refills given. Labs drawn today.       Other Visit Diagnoses     Elevated PSA       Awaiting biopsy results with urology. Rechecking PSA today.   Relevant Orders   PSA        Follow up plan: Return for physical.

## 2023-04-13 ENCOUNTER — Other Ambulatory Visit: Payer: Self-pay | Admitting: Family Medicine

## 2023-04-13 DIAGNOSIS — D72829 Elevated white blood cell count, unspecified: Secondary | ICD-10-CM

## 2023-04-13 LAB — CBC WITH DIFFERENTIAL/PLATELET
Basophils Absolute: 0.1 10*3/uL (ref 0.0–0.2)
Basos: 1 %
EOS (ABSOLUTE): 0.3 10*3/uL (ref 0.0–0.4)
Eos: 3 %
Hematocrit: 46.6 % (ref 37.5–51.0)
Hemoglobin: 15.9 g/dL (ref 13.0–17.7)
Immature Grans (Abs): 0.2 10*3/uL — ABNORMAL HIGH (ref 0.0–0.1)
Immature Granulocytes: 2 %
Lymphocytes Absolute: 3.8 10*3/uL — ABNORMAL HIGH (ref 0.7–3.1)
Lymphs: 33 %
MCH: 32 pg (ref 26.6–33.0)
MCHC: 34.1 g/dL (ref 31.5–35.7)
MCV: 94 fL (ref 79–97)
Monocytes Absolute: 1.2 10*3/uL — ABNORMAL HIGH (ref 0.1–0.9)
Monocytes: 10 %
Neutrophils Absolute: 6 10*3/uL (ref 1.4–7.0)
Neutrophils: 51 %
Platelets: 283 10*3/uL (ref 150–450)
RBC: 4.97 x10E6/uL (ref 4.14–5.80)
RDW: 12.7 % (ref 11.6–15.4)
WBC: 11.7 10*3/uL — ABNORMAL HIGH (ref 3.4–10.8)

## 2023-04-13 LAB — BASIC METABOLIC PANEL
BUN/Creatinine Ratio: 16 (ref 10–24)
BUN: 21 mg/dL (ref 8–27)
CO2: 22 mmol/L (ref 20–29)
Calcium: 9.8 mg/dL (ref 8.6–10.2)
Chloride: 102 mmol/L (ref 96–106)
Creatinine, Ser: 1.31 mg/dL — ABNORMAL HIGH (ref 0.76–1.27)
Glucose: 80 mg/dL (ref 70–99)
Potassium: 4.5 mmol/L (ref 3.5–5.2)
Sodium: 138 mmol/L (ref 134–144)
eGFR: 62 mL/min/{1.73_m2} (ref >59)

## 2023-04-13 LAB — PSA: Prostate Specific Ag, Serum: 6.4 ng/mL — ABNORMAL HIGH (ref 0.0–4.0)

## 2023-04-19 ENCOUNTER — Encounter: Payer: Self-pay | Admitting: Urology

## 2023-04-19 ENCOUNTER — Ambulatory Visit: Payer: 59 | Admitting: Urology

## 2023-04-19 VITALS — BP 147/85 | HR 92 | Ht 68.0 in | Wt 235.0 lb

## 2023-04-19 DIAGNOSIS — R972 Elevated prostate specific antigen [PSA]: Secondary | ICD-10-CM

## 2023-04-19 DIAGNOSIS — C61 Malignant neoplasm of prostate: Secondary | ICD-10-CM

## 2023-04-19 NOTE — Progress Notes (Signed)
I,Amy L Pierron,acting as a scribe for Riki Altes, MD.,have documented all relevant documentation on the behalf of Riki Altes, MD,as directed by  Riki Altes, MD while in the presence of Riki Altes, MD.  04/19/2023 3:51 PM   Alcus Dad 02-08-1961 [id number]  Referring provider: Dorcas Carrow, DO [address] [place],  [place] [address]  Chief Complaint  Patient presents with   Results    HPI: Douglas Murillo is a 62 y.o. male who presents today for prostate biopsy follow-up.   Underwent MR fusion biopsy by Dr. Richardo Hanks, 10/232024, for a PSA of 4.7 and PI-RADS 5 lesion on prostate MRI. He underwent a standard 12-core template biopsy, +3 ROI cores. No post-biopsy complaints. Pathology: ROI biopsy showed Gleason 30+3 adenocarcinoma involving 15% of the submitted tissue period. The left mid-core showed Gleason 3+3 adenocarcinoma involving 49% of the submitted tissue; biopsy from the right lateral base showed a typical small acinar proliferation.  PMH: Past Medical History:  Diagnosis Date   GERD (gastroesophageal reflux disease)    Hypertension     Surgical History: Past Surgical History:  Procedure Laterality Date   ANAL FISSURE REPAIR     CHOLECYSTECTOMY     PROSTATE BIOPSY      Home Medications:  Allergies as of 04/19/2023       Reactions   Prednisone Other (See Comments)   Joint Pain   Hctz [hydrochlorothiazide] Other (See Comments)   Headache        Medication List        Accurate as of April 19, 2023  3:51 PM. If you have any questions, ask your nurse or doctor.          albuterol 108 (90 Base) MCG/ACT inhaler Commonly known as: VENTOLIN HFA Inhale 1-2 puffs into the lungs every 6 (six) hours as needed.   esomeprazole 20 MG capsule Commonly known as: NEXIUM Take 1 capsule (20 mg total) by mouth daily at 12 noon.   lisinopril 20 MG tablet Commonly known as: ZESTRIL TAKE 1 TABLET BY MOUTH EVERY DAY    MULTIVITAMIN PO Take by mouth daily.   nortriptyline 50 MG capsule Commonly known as: PAMELOR Take 1 capsule (50 mg total) by mouth at bedtime.        Allergies:  Allergies  Allergen Reactions   Prednisone Other (See Comments)    Joint Pain   Hctz [Hydrochlorothiazide] Other (See Comments)    Headache    Family History: Family History  Problem Relation Age of Onset   Arthritis Mother    Cancer Mother        breast   Cancer Father        lymphoma   Diabetes Father    Hearing loss Father    Heart disease Father    Hypertension Father    Kidney disease Father    Stroke Maternal Grandmother    Arthritis Maternal Grandmother    Birth defects Maternal Grandfather    Arthritis Paternal Grandmother    Heart disease Paternal Caprice Renshaw' disease Daughter    Fibromyalgia Sister    Heart disease Brother    Cancer Brother     Social History:  reports that he quit smoking about 7 years ago. His smoking use included cigarettes. He has been exposed to tobacco smoke. He has never used smokeless tobacco. He reports current drug use. Drug: Marijuana. He reports that he does not drink alcohol.   Physical  Exam: BP (!) 147/85   Pulse 92   Ht 5\' 8"  (1.727 m)   Wt 235 lb (106.6 kg)   BMI 35.73 kg/m   Constitutional:  Alert and oriented, No acute distress. Psychiatric: Normal mood and affect.   Assessment & Plan:    1. 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 NCCN risk stratification, low. We discussed management options for low-risk prostate cancer with active surveillance being the preferred option. We discussed active surveillance involves Q6 month visits for PSA and a DRE annually.  We also discussed a confirmatory biopsy as recommended within 1-2 years of the initial diagnosis. His prostate MRI would be repeated prior to a confirmatory biopsy.  Other management options including EBRT/ brachytherapy and radical prostatectomy were  discussed. HIFU was discussed, though is not a recommended NCCN option.  He is comfortable with active surveillance and will schedule a six month follow up with PSA.   I have reviewed the above documentation for accuracy and completeness, and I agree with the above.   Riki Altes, MD  [facility] : [facility] [address] [place], [place] [address] [phone]

## 2023-04-20 ENCOUNTER — Encounter: Payer: Self-pay | Admitting: Oncology

## 2023-04-20 ENCOUNTER — Inpatient Hospital Stay: Payer: 59 | Attending: Oncology | Admitting: Oncology

## 2023-04-20 ENCOUNTER — Inpatient Hospital Stay: Payer: 59

## 2023-04-20 ENCOUNTER — Encounter: Payer: Self-pay | Admitting: Urology

## 2023-04-20 VITALS — BP 132/74 | HR 67 | Temp 98.5°F | Resp 18 | Ht 68.0 in | Wt 233.0 lb

## 2023-04-20 DIAGNOSIS — Z807 Family history of other malignant neoplasms of lymphoid, hematopoietic and related tissues: Secondary | ICD-10-CM | POA: Insufficient documentation

## 2023-04-20 DIAGNOSIS — D72820 Lymphocytosis (symptomatic): Secondary | ICD-10-CM

## 2023-04-20 DIAGNOSIS — Z87891 Personal history of nicotine dependence: Secondary | ICD-10-CM | POA: Insufficient documentation

## 2023-04-20 DIAGNOSIS — Z8546 Personal history of malignant neoplasm of prostate: Secondary | ICD-10-CM | POA: Insufficient documentation

## 2023-04-20 DIAGNOSIS — Z803 Family history of malignant neoplasm of breast: Secondary | ICD-10-CM | POA: Insufficient documentation

## 2023-04-20 LAB — CBC WITH DIFFERENTIAL/PLATELET
Abs Immature Granulocytes: 0.13 10*3/uL — ABNORMAL HIGH (ref 0.00–0.07)
Basophils Absolute: 0.1 10*3/uL (ref 0.0–0.1)
Basophils Relative: 1 %
Eosinophils Absolute: 0.3 10*3/uL (ref 0.0–0.5)
Eosinophils Relative: 3 %
HCT: 47.2 % (ref 39.0–52.0)
Hemoglobin: 15.9 g/dL (ref 13.0–17.0)
Immature Granulocytes: 1 %
Lymphocytes Relative: 24 %
Lymphs Abs: 2.4 10*3/uL (ref 0.7–4.0)
MCH: 31.4 pg (ref 26.0–34.0)
MCHC: 33.7 g/dL (ref 30.0–36.0)
MCV: 93.1 fL (ref 80.0–100.0)
Monocytes Absolute: 0.9 10*3/uL (ref 0.1–1.0)
Monocytes Relative: 9 %
Neutro Abs: 6.5 10*3/uL (ref 1.7–7.7)
Neutrophils Relative %: 62 %
Platelets: 254 10*3/uL (ref 150–400)
RBC: 5.07 MIL/uL (ref 4.22–5.81)
RDW: 13.1 % (ref 11.5–15.5)
WBC: 10.3 10*3/uL (ref 4.0–10.5)
nRBC: 0 % (ref 0.0–0.2)

## 2023-04-21 NOTE — Progress Notes (Signed)
Hematology/Oncology Consult note [facility] Telephone:([phone] Fax:([phone]) [phone]  Patient Care Team: Dorcas Carrow, DO as PCP - General (Family Medicine)   Name of the patient: Douglas Murillo  [id number]  1961/01/22    Reason for referral-leucocytosis   Referring physician-Dr. Olevia Perches  Date of visit: 04/21/23   History of presenting illness-patient is a 62 year old male who was recently diagnosed with T1 cN0 M0 adenocarcinoma of the prostate and follows up with urology.  Expectant management has been Recommended for the same.  He has been referred to me for leukocytosis.  Patient's white cell count has been ranging between 11-14 over the last 2 years.  Prior to that his white count has been between 9-10.  Hemoglobin and platelets are normal.  Differential on most occasions has shown neutrophilia but also at some point has shown lymphocytosis and monocytosis.  Most recent differential showed possible immature granulocytes  ECOG PS- 0  Pain scale- 0   Review of systems- Review of Systems  Constitutional:  Negative for chills, fever, malaise/fatigue and weight loss.  HENT:  Negative for congestion, ear discharge and nosebleeds.   Eyes:  Negative for blurred vision.  Respiratory:  Negative for cough, hemoptysis, sputum production, shortness of breath and wheezing.   Cardiovascular:  Negative for chest pain, palpitations, orthopnea and claudication.  Gastrointestinal:  Negative for abdominal pain, blood in stool, constipation, diarrhea, heartburn, melena, nausea and vomiting.  Genitourinary:  Negative for dysuria, flank pain, frequency, hematuria and urgency.  Musculoskeletal:  Negative for back pain, joint pain and myalgias.  Skin:  Negative for rash.  Neurological:  Negative for dizziness, tingling, focal weakness, seizures, weakness and headaches.  Endo/Heme/Allergies:  Does not bruise/bleed easily.  Psychiatric/Behavioral:  Negative for  depression and suicidal ideas. The patient does not have insomnia.     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   HTN (hypertension) 07/12/2016     Past Medical History:  Diagnosis Date   GERD (gastroesophageal reflux disease)    Hypertension    Prostate cancer (HCC)      Past Surgical History:  Procedure Laterality Date   ANAL FISSURE REPAIR     CHOLECYSTECTOMY     PROSTATE BIOPSY      Social History   Socioeconomic History   Marital status: Married    Spouse name: Not on file   Number of children: Not on file   Years of education: Not on file   Highest education level: Associate degree: occupational, Scientist, product/process development, or vocational program  Occupational History   Not on file  Tobacco Use   Smoking status: Former    Current packs/day: 0.00    Types: Cigarettes    Quit date: 12/20/2015    Years since quitting: 7.3    Passive exposure: Past   Smokeless tobacco: Never  Vaping Use   Vaping status: Some Days   Substances: THC  Substance and Sexual Activity   Alcohol use: No   Drug use: Yes    Types: Marijuana   Sexual activity: Yes  Other Topics Concern   Not on file  Social History Narrative   Not on file   Social Determinants of Health   Financial Resource Strain: Low Risk  (04/11/2023)   Overall  Financial Resource Strain (CARDIA)    Difficulty of Paying Living Expenses: Not very hard  Food Insecurity: No Food Insecurity (04/20/2023)   Hunger Vital Sign    Worried About Running Out of Food in the Last Year: Never true    Ran Out of Food in the Last Year: Never true  Transportation Needs: No Transportation Needs (04/20/2023)   PRAPARE - Administrator, Civil Service (Medical): No    Lack of Transportation (Non-Medical): No   Physical Activity: Sufficiently Active (04/11/2023)   Exercise Vital Sign    Days of Exercise per Week: 5 days    Minutes of Exercise per Session: 150+ min  Stress: No Stress Concern Present (04/11/2023)   [employer] of Occupational Health - Occupational Stress Questionnaire    Feeling of Stress : Not at all  Social Connections: Unknown (04/11/2023)   Social Connection and Isolation Panel [NHANES]    Frequency of Communication with Friends and Family: More than three times a week    Frequency of Social Gatherings with Friends and Family: Twice a week    Attends Religious Services: Patient declined    Database administrator or Organizations: Patient declined    Attends Banker Meetings: Not on file    Marital Status: Married  Intimate Partner Violence: Not At Risk (04/20/2023)   Humiliation, Afraid, Rape, and Kick questionnaire    Fear of Current or Ex-Partner: No    Emotionally Abused: No    Physically Abused: No    Sexually Abused: No     Family History  Problem Relation Age of Onset   Arthritis Mother    Cancer Mother        breast   Cancer Father        lymphoma   Diabetes Father    Hearing loss Father    Heart disease Father    Hypertension Father    Kidney disease Father    Stroke Maternal Grandmother    Arthritis Maternal Grandmother    Birth defects Maternal Grandfather    Arthritis Paternal Grandmother    Heart disease Paternal Caprice Renshaw' disease Daughter    Fibromyalgia Sister    Heart disease Brother    Cancer Brother      Current Outpatient Medications:    albuterol (VENTOLIN HFA) 108 (90 Base) MCG/ACT inhaler, Inhale 1-2 puffs into the lungs every 6 (six) hours as needed., Disp: 18 g, Rfl: 3   esomeprazole (NEXIUM) 20 MG capsule, Take 1 capsule (20 mg total) by mouth daily at 12 noon., Disp: 90 capsule, Rfl: 3   lisinopril (ZESTRIL) 20 MG tablet, TAKE 1 TABLET BY MOUTH EVERY DAY, Disp: 90 tablet, Rfl: 1   Melatonin 5 MG  CAPS, Take by mouth., Disp: , Rfl:    Multiple Vitamins-Minerals (MULTIVITAMIN PO), Take by mouth daily., Disp: , Rfl:    nortriptyline (PAMELOR) 50 MG capsule, Take 1 capsule (50 mg total) by mouth at bedtime., Disp: 90 capsule, Rfl: 1   Physical exam:  Vitals:   04/20/23 1346  BP: 132/74  Pulse: 67  Resp: 18  Temp: 98.5 F (36.9 C)  TempSrc: Tympanic  SpO2: 95%  Weight: 233 lb (105.7 kg)  Height: 5\' 8"  (1.727 m)   Physical Exam Cardiovascular:     Rate and Rhythm: Normal rate and regular rhythm.     Heart sounds: Normal heart sounds.  Pulmonary:     Effort: Pulmonary effort is normal.  Breath sounds: Normal breath sounds.  Abdominal:     General: Bowel sounds are normal.     Palpations: Abdomen is soft.     Comments: No Palpable hepatosplenomegaly  Lymphadenopathy:     Comments: No palpable cervical, supraclavicular, axillary or inguinal adenopathy    Skin:    General: Skin is warm and dry.  Neurological:     Mental Status: He is alert and oriented to person, place, and time.           Latest Ref Rng & Units 04/12/2023    3:48 PM  CMP  Glucose 70 - 99 mg/dL 80   BUN 8 - 27 mg/dL 21   Creatinine 1.91 - 1.27 mg/dL 4.78   Sodium 295 - 621 mmol/L 138   Potassium 3.5 - 5.2 mmol/L 4.5   Chloride 96 - 106 mmol/L 102   CO2 20 - 29 mmol/L 22   Calcium 8.6 - 10.2 mg/dL 9.8       Latest Ref Rng & Units 04/20/2023    2:23 PM  CBC  WBC 4.0 - 10.5 K/uL 10.3   Hemoglobin 13.0 - 17.0 g/dL 30.8   Hematocrit 65.7 - 52.0 % 47.2   Platelets 150 - 400 K/uL 254     Assessment and plan- Patient is a 62 y.o. male followed for leukocytosis/lymphocytosis.    Discussed differences between reactive and clonal lymphocytosis.  Overall patient's white cell count has been stable between 11-13 for the last few years.  He does not have any B symptoms.  No other cytopenias.  No palpable adenopathy or hepatosplenomegaly on exam.  I will be checking CBC and peripheral flow cytometry  today.  I will see him back in 2 weeks to discuss the results of blood work and further management.  Even if he were to be diagnosed with CLL he does not have any indications for treatment for the same at this time   Thank you for this kind referral and the opportunity to participate in the care of this patient   Visit Diagnosis 1. Lymphocytosis     Dr. Owens Shark, MD, MPH [facility] [id number] 04/21/2023

## 2023-04-24 LAB — COMP PANEL: LEUKEMIA/LYMPHOMA

## 2023-05-07 ENCOUNTER — Inpatient Hospital Stay: Payer: 59 | Attending: Oncology | Admitting: Oncology

## 2023-05-07 ENCOUNTER — Encounter: Payer: Self-pay | Admitting: Oncology

## 2023-05-07 DIAGNOSIS — C61 Malignant neoplasm of prostate: Secondary | ICD-10-CM

## 2023-05-07 DIAGNOSIS — Z803 Family history of malignant neoplasm of breast: Secondary | ICD-10-CM

## 2023-05-07 DIAGNOSIS — Z807 Family history of other malignant neoplasms of lymphoid, hematopoietic and related tissues: Secondary | ICD-10-CM | POA: Diagnosis not present

## 2023-05-07 DIAGNOSIS — Z87891 Personal history of nicotine dependence: Secondary | ICD-10-CM | POA: Diagnosis not present

## 2023-05-07 DIAGNOSIS — D72820 Lymphocytosis (symptomatic): Secondary | ICD-10-CM | POA: Diagnosis not present

## 2023-05-08 NOTE — Progress Notes (Signed)
I connected with Douglas Murillo on 05/08/23 at  3:00 PM EST by video enabled telemedicine visit and verified that I am speaking with the correct person using two identifiers.   I discussed the limitations, risks, security and privacy concerns of performing an evaluation and management service by telemedicine and the availability of in-person appointments. I also discussed with the patient that there may be a patient responsible charge related to this service. The patient expressed understanding and agreed to proceed.  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  Expectant management has been Recommended for the same.  He has been referred to me for leukocytosis.  Patient's white cell count has been ranging between 11-14 over the last 2 years.  Prior to that his white count has been between 9-10.  Hemoglobin and platelets are normal.  Differential on most occasions has shown neutrophilia but also at some point has shown lymphocytosis and monocytosis.  Most recent differential showed possible immature granulocytes   CBC from 04/20/2023 showed a normal white count hemoglobin and platelets.  Flow cytometry did not show any evidence of immunophenotypic abnormality.  Interval history patient is doing well presently and denies any specific complaints at this time   Review of Systems  Constitutional:  Negative for chills, fever, malaise/fatigue and weight loss.  HENT:  Negative for congestion, ear discharge and nosebleeds.   Eyes:  Negative for blurred vision.  Respiratory:  Negative for cough, hemoptysis, sputum production, shortness of breath and wheezing.   Cardiovascular:  Negative for chest pain,  palpitations, orthopnea and claudication.  Gastrointestinal:  Negative for abdominal pain, blood in stool, constipation, diarrhea, heartburn, melena, nausea and vomiting.  Genitourinary:  Negative for dysuria, flank pain, frequency, hematuria and urgency.  Musculoskeletal:  Negative for back pain, joint pain and myalgias.  Skin:  Negative for rash.  Neurological:  Negative for dizziness, tingling, focal weakness, seizures, weakness and headaches.  Endo/Heme/Allergies:  Does not bruise/bleed easily.  Psychiatric/Behavioral:  Negative for depression and suicidal ideas. The patient does not have insomnia.     Allergies  Allergen Reactions   Prednisone Other (See Comments)    Joint Pain   Hctz [Hydrochlorothiazide] Other (See Comments)    Headache    Past Medical History:  Diagnosis Date   GERD (gastroesophageal reflux disease)    Hypertension    Prostate cancer (HCC)     Past Surgical History:  Procedure Laterality Date   ANAL FISSURE REPAIR     CHOLECYSTECTOMY     PROSTATE BIOPSY      Social History   Socioeconomic History   Marital status: Married    Spouse name: Not on file   Number of children: Not on file   Years of education: Not on file   Highest education level: Associate degree: occupational, Scientist, product/process development, or vocational program  Occupational History   Not on file  Tobacco Use   Smoking status: Former    Current packs/day: 0.00    Types: Cigarettes    Quit date: 12/20/2015    Years since quitting: 7.3    Passive exposure: Past   Smokeless tobacco: Never  Vaping Use   Vaping status: Some Days   Substances: THC  Substance and Sexual  Activity   Alcohol use: No   Drug use: Yes    Types: Marijuana   Sexual activity: Yes  Other Topics Concern   Not on file  Social History Narrative   Not on file   Social Determinants of Health   Financial Resource Strain: Low Risk  (04/11/2023)   Overall Financial Resource Strain (CARDIA)    Difficulty of Paying Living  Expenses: Not very hard  Food Insecurity: No Food Insecurity (04/20/2023)   Hunger Vital Sign    Worried About Running Out of Food in the Last Year: Never true    Ran Out of Food in the Last Year: Never true  Transportation Needs: No Transportation Needs (04/20/2023)   PRAPARE - Administrator, Civil Service (Medical): No    Lack of Transportation (Non-Medical): No  Physical Activity: Sufficiently Active (04/11/2023)   Exercise Vital Sign    Days of Exercise per Week: 5 days    Minutes of Exercise per Session: 150+ min  Stress: No Stress Concern Present (04/11/2023)   [employer] of Occupational Health - Occupational Stress Questionnaire    Feeling of Stress : Not at all  Social Connections: Unknown (04/11/2023)   Social Connection and Isolation Panel [NHANES]    Frequency of Communication with Friends and Family: More than three times a week    Frequency of Social Gatherings with Friends and Family: Twice a week    Attends Religious Services: Patient declined    Database administrator or Organizations: Patient declined    Attends Banker Meetings: Not on file    Marital Status: Married  Intimate Partner Violence: Not At Risk (04/20/2023)   Humiliation, Afraid, Rape, and Kick questionnaire    Fear of Current or Ex-Partner: No    Emotionally Abused: No    Physically Abused: No    Sexually Abused: No    Family History  Problem Relation Age of Onset   Arthritis Mother    Cancer Mother        breast   Cancer Father        lymphoma   Diabetes Father    Hearing loss Father    Heart disease Father    Hypertension Father    Kidney disease Father    Stroke Maternal Grandmother    Arthritis Maternal Grandmother    Birth defects Maternal Grandfather    Arthritis Paternal Grandmother    Heart disease Paternal Caprice Renshaw' disease Daughter    Fibromyalgia Sister    Heart disease Brother    Cancer Brother      Current Outpatient  Medications:    albuterol (VENTOLIN HFA) 108 (90 Base) MCG/ACT inhaler, Inhale 1-2 puffs into the lungs every 6 (six) hours as needed., Disp: 18 g, Rfl: 3   esomeprazole (NEXIUM) 20 MG capsule, Take 1 capsule (20 mg total) by mouth daily at 12 noon., Disp: 90 capsule, Rfl: 3   lisinopril (ZESTRIL) 20 MG tablet, TAKE 1 TABLET BY MOUTH EVERY DAY, Disp: 90 tablet, Rfl: 1   Melatonin 5 MG CAPS, Take by mouth., Disp: , Rfl:    Multiple Vitamins-Minerals (MULTIVITAMIN PO), Take by mouth daily., Disp: , Rfl:    nortriptyline (PAMELOR) 50 MG capsule, Take 1 capsule (50 mg total) by mouth at bedtime., Disp: 90 capsule, Rfl: 1  No results found.  No images are attached to the encounter.      Latest Ref Rng & Units 04/12/2023    3:48 PM  CMP  Glucose 70 - 99 mg/dL 80   BUN 8 - 27 mg/dL 21   Creatinine 8.29 - 1.27 mg/dL 5.62   Sodium 130 - 865 mmol/L 138   Potassium 3.5 - 5.2 mmol/L 4.5   Chloride 96 - 106 mmol/L 102   CO2 20 - 29 mmol/L 22   Calcium 8.6 - 10.2 mg/dL 9.8       Latest Ref Rng & Units 04/20/2023    2:23 PM  CBC  WBC 4.0 - 10.5 K/uL 10.3   Hemoglobin 13.0 - 17.0 g/dL 78.4   Hematocrit 69.6 - 52.0 % 47.2   Platelets 150 - 400 K/uL 254      Observation/objective: Appears in no acute distress over video visit today.  Breathing is nonlabored  Assessment and plan: Patient is a 62 year old male referred for lymphocytosis Likely reactive  Patient noted to have mildly elevated white cell count fluctuating between 11-14 over the last 2 years without a clear rising trend.  Hemoglobin and platelets have been normal.  Differential is mainly showed neutrophilia and lymphocytosis.  Peripheral flow cytometry does not show any evidence of clonality suggestive of CLL.  I suspect his leukocytosis/lymphocytosis is reactive and can be monitored conservatively without any further tests like bone marrow biopsy at this time.  I will repeat CBC with differential in 6 months in 1 year and see him  back in 1 year  Follow-up instructions: As above  I discussed the assessment and treatment plan with the patient. The patient was provided an opportunity to ask questions and all were answered. The patient agreed with the plan and demonstrated an understanding of the instructions.   The patient was advised to call back or seek an in-person evaluation if the symptoms worsen or if the condition fails to improve as anticipated.  I provided 11 minutes of face-to-face video visit time during this encounter including time spent in reviewing labs, discussion of treatment plan and coordinating future appointments   Visit Diagnosis: 1. Lymphocytosis     Dr. Owens Shark, MD, MPH [facility] at [facility] Tel- [phone] 05/08/2023 8:48 AM

## 2023-07-20 NOTE — Telephone Encounter (Signed)
Error

## 2023-07-29 ENCOUNTER — Other Ambulatory Visit: Payer: Self-pay | Admitting: Family Medicine

## 2023-07-29 DIAGNOSIS — Z8709 Personal history of other diseases of the respiratory system: Secondary | ICD-10-CM

## 2023-07-31 NOTE — Telephone Encounter (Signed)
 Requested Prescriptions  Pending Prescriptions Disp Refills   albuterol (VENTOLIN HFA) 108 (90 Base) MCG/ACT inhaler [Pharmacy Med Name: ALBUTEROL HFA (VENTOLIN) INH] 18 each 0    Sig: INHALE 1-2 PUFFS INTO THE LUNGS EVERY 6 HOURS AS NEEDED.     Pulmonology:  Beta Agonists 2 Passed - 07/31/2023  9:21 AM      Passed - Last BP in normal range    BP Readings from Last 1 Encounters:  04/20/23 132/74         Passed - Last Heart Rate in normal range    Pulse Readings from Last 1 Encounters:  04/20/23 67         Passed - Valid encounter within last 12 months    Recent Outpatient Visits           3 months ago Primary hypertension   [facility] [place], Megan P, DO   9 months ago Routine general medical examination at a health care facility   [facility] [place], [place] P, DO   1 year ago Acute nonintractable headache, unspecified headache type   [place] [facility] [place], Megan P, DO   1 year ago Neuropathy   [place] [facility] [place], [place], DO   1 year ago Primary hypertension   [facility] [place], Oralia Rud, DO       Future Appointments             In 2 months La[place] Benes, Oralia Rud, DO [facility]   In 2 months Stoioff, Verna Czech, MD [facility]

## 2023-08-29 ENCOUNTER — Other Ambulatory Visit: Payer: Self-pay | Admitting: Family Medicine

## 2023-08-30 NOTE — Telephone Encounter (Signed)
 Requested Prescriptions  Refused Prescriptions Disp Refills   nortriptyline (PAMELOR) 50 MG capsule [Pharmacy Med Name: NORTRIPTYLINE HCL 50 MG CAP] 90 capsule 2    Sig: TAKE 1 CAPSULE BY MOUTH AT BEDTIME.     Psychiatry:  Antidepressants - Heterocyclics (TCAs) Failed - 08/30/2023  2:31 PM      Failed - Valid encounter within last 6 months    Recent Outpatient Visits   None     Future Appointments             In 1 month Johnson, Oralia Rud, DO [place] [facility]   In 1 month Stoioff, Verna Czech, MD [facility] [place]

## 2023-09-27 ENCOUNTER — Other Ambulatory Visit: Payer: Self-pay | Admitting: Family Medicine

## 2023-09-28 NOTE — Telephone Encounter (Signed)
 Requested Prescriptions  Pending Prescriptions Disp Refills   nortriptyline  (PAMELOR ) 50 MG capsule [Pharmacy Med Name: NORTRIPTYLINE  HCL 50 MG CAP] 90 capsule 0    Sig: TAKE 1 CAPSULE BY MOUTH AT BEDTIME.     Psychiatry:  Antidepressants - Heterocyclics (TCAs) Failed - 09/28/2023  9:59 AM      Failed - Valid encounter within last 6 months    Recent Outpatient Visits   None     Future Appointments             In 1 month Stoioff, Kizzie Perks, MD [facility]

## 2023-10-03 ENCOUNTER — Ambulatory Visit: Admitting: Family Medicine

## 2023-10-05 ENCOUNTER — Ambulatory Visit: Payer: Self-pay | Admitting: Family Medicine

## 2023-10-09 ENCOUNTER — Ambulatory Visit: Payer: 59 | Admitting: Family Medicine

## 2023-10-11 ENCOUNTER — Encounter (HOSPITAL_COMMUNITY): Payer: Self-pay

## 2023-10-17 ENCOUNTER — Other Ambulatory Visit: Payer: Self-pay

## 2023-10-17 ENCOUNTER — Other Ambulatory Visit

## 2023-10-17 DIAGNOSIS — R972 Elevated prostate specific antigen [PSA]: Secondary | ICD-10-CM | POA: Diagnosis not present

## 2023-10-18 ENCOUNTER — Ambulatory Visit (INDEPENDENT_AMBULATORY_CARE_PROVIDER_SITE_OTHER): Admitting: Family Medicine

## 2023-10-18 ENCOUNTER — Encounter: Payer: Self-pay | Admitting: Family Medicine

## 2023-10-18 ENCOUNTER — Ambulatory Visit: Admitting: Urology

## 2023-10-18 VITALS — BP 136/78 | HR 87 | Ht 68.0 in | Wt 231.2 lb

## 2023-10-18 DIAGNOSIS — Z8709 Personal history of other diseases of the respiratory system: Secondary | ICD-10-CM

## 2023-10-18 DIAGNOSIS — I10 Essential (primary) hypertension: Secondary | ICD-10-CM | POA: Diagnosis not present

## 2023-10-18 DIAGNOSIS — G629 Polyneuropathy, unspecified: Secondary | ICD-10-CM

## 2023-10-18 DIAGNOSIS — R972 Elevated prostate specific antigen [PSA]: Secondary | ICD-10-CM | POA: Diagnosis not present

## 2023-10-18 DIAGNOSIS — Z0000 Encounter for general adult medical examination without abnormal findings: Secondary | ICD-10-CM

## 2023-10-18 LAB — PSA: Prostate Specific Ag, Serum: 5.9 ng/mL — ABNORMAL HIGH (ref 0.0–4.0)

## 2023-10-18 MED ORDER — ESOMEPRAZOLE MAGNESIUM 20 MG PO CPDR: 20.0000 mg | DELAYED_RELEASE_CAPSULE | Freq: Every day | ORAL | 3 refills | Status: DC

## 2023-10-18 MED ORDER — LISINOPRIL 20 MG PO TABS: ORAL_TABLET | ORAL | 1 refills | Status: DC

## 2023-10-18 MED ORDER — NORTRIPTYLINE HCL 50 MG PO CAPS: 50.0000 mg | ORAL_CAPSULE | Freq: Every day | ORAL | 1 refills | Status: DC

## 2023-10-18 MED ORDER — ALBUTEROL SULFATE HFA 108 (90 BASE) MCG/ACT IN AERS: 1.0000 | INHALATION_SPRAY | Freq: Four times a day (QID) | RESPIRATORY_TRACT | 6 refills | Status: DC | PRN

## 2023-10-18 NOTE — Assessment & Plan Note (Signed)
 Under good control on current regimen. Continue current regimen. Continue to monitor. Call with any concerns. Refills given. Labs drawn today.

## 2023-10-18 NOTE — Assessment & Plan Note (Signed)
 Under good control on current regimen. Continue current regimen. Continue to monitor. Call with any concerns. Refills given.

## 2023-10-18 NOTE — Progress Notes (Signed)
 BP 136/78 (BP Location: Left Arm, Patient Position: Sitting, Cuff Size: Large)   Pulse 87   Ht 5\' 8"  (1.727 m)   Wt 231 lb 3.2 oz (104.9 kg)   SpO2 94%   BMI 35.15 kg/m    Subjective:    Patient ID: Douglas Murillo, male    DOB: 02/25/1961, 63 y.o.   MRN: [id number]  HPI: Douglas Murillo is a 63 y.o. male presenting on 10/18/2023 for comprehensive medical examination. Current medical complaints include:  HYPERTENSION  Hypertension status: controlled  Satisfied with current treatment? yes Duration of hypertension: chronic BP monitoring frequency:  not checking BP medication side effects:  no Medication compliance: excellent compliance Previous BP meds:lisinopril  Aspirin: no Recurrent headaches: no Visual changes: no Palpitations: no Dyspnea: no Chest pain: no Lower extremity edema: no Dizzy/lightheaded: no  NEUROPATHY Neuropathy status: controlled  Satisfied with current treatment?: yes Medication side effects: no Medication compliance:  excellent compliance Location: feet Pain: no Severity: mild  Quality:  numb and tingling Frequency: constant Bilateral: yes Symmetric: yes Numbness: yes Decreased sensation: yes Weakness: no Context: better  He currently lives with: wife Interim Problems from his last visit: no  Depression Screen done today and results listed below:     04/20/2023    1:51 PM 07/07/2022    8:52 AM 06/15/2021    9:44 AM 09/27/2020    2:59 PM 03/29/2020    4:22 PM  Depression screen PHQ 2/9  Decreased Interest 0 2 0 0 0  Down, Depressed, Hopeless 0 0 0 0 0  PHQ - 2 Score 0 2 0 0 0  Altered sleeping  1 0  0  Tired, decreased energy  3 0  0  Change in appetite  1 0  0  Feeling bad or failure about yourself   0 0  0  Trouble concentrating  0 0  0  Moving slowly or fidgety/restless  0 0  0  Suicidal thoughts  0 0  0  PHQ-9 Score  7 0  0  Difficult doing work/chores  Not difficult at all   Not difficult at all    Past Medical  History:  Past Medical History:  Diagnosis Date   Arthritis    In Hands   GERD (gastroesophageal reflux disease)    Hypertension    Neuromuscular disorder (HCC)    Neuropathy   Prostate cancer (HCC)     Surgical History:  Past Surgical History:  Procedure Laterality Date   ANAL FISSURE REPAIR     CHOLECYSTECTOMY     HERNIA REPAIR     PROSTATE BIOPSY      Medications:  Current Outpatient Medications on File Prior to Visit  Medication Sig   Melatonin 5 MG CAPS Take by mouth.   Multiple Vitamins-Minerals (MULTIVITAMIN PO) Take by mouth daily.   No current facility-administered medications on file prior to visit.    Allergies:  Allergies  Allergen Reactions   Prednisone Other (See Comments)    Joint Pain   Hctz [Hydrochlorothiazide ] Other (See Comments)    Headache    Social History:  Social History   Socioeconomic History   Marital status: Married    Spouse name: Not on file   Number of children: Not on file   Years of education: Not on file   Highest education level: Associate degree: academic program  Occupational History   Not on file  Tobacco Use   Smoking status: Former    Current packs/day: 0.00  Average packs/day: 1 pack/day for 15.0 years (15.0 ttl pk-yrs)    Types: Cigarettes    Quit date: 12/20/2015    Years since quitting: 7.8    Passive exposure: Past   Smokeless tobacco: Never  Vaping Use   Vaping status: Some Days   Substances: THC  Substance and Sexual Activity   Alcohol use: No   Drug use: Yes    Frequency: 2.0 times per week    Types: Marijuana   Sexual activity: Not Currently  Other Topics Concern   Not on file  Social History Narrative   Not on file   Social Drivers of Health   Financial Resource Strain: Patient Declined (10/17/2023)   Overall Financial Resource Strain (CARDIA)    Difficulty of Paying Living Expenses: Patient declined  Food Insecurity: Patient Declined (10/17/2023)   Hunger Vital Sign    Worried About  Running Out of Food in the Last Year: Patient declined    Ran Out of Food in the Last Year: Patient declined  Transportation Needs: No Transportation Needs (10/17/2023)   PRAPARE - Administrator, Civil Service (Medical): No    Lack of Transportation (Non-Medical): No  Physical Activity: Sufficiently Active (10/17/2023)   Exercise Vital Sign    Days of Exercise per Week: 6 days    Minutes of Exercise per Session: 30 min  Stress: Patient Declined (10/17/2023)   [employer] of Occupational Health - Occupational Stress Questionnaire    Feeling of Stress : Patient declined  Social Connections: Unknown (10/17/2023)   Social Connection and Isolation Panel [NHANES]    Frequency of Communication with Friends and Family: Patient declined    Frequency of Social Gatherings with Friends and Family: Patient declined    Attends Religious Services: Patient declined    Database administrator or Organizations: Patient declined    Attends Banker Meetings: Not on file    Marital Status: Married  Catering manager Violence: Not At Risk (04/20/2023)   Humiliation, Afraid, Rape, and Kick questionnaire    Fear of Current or Ex-Partner: No    Emotionally Abused: No    Physically Abused: No    Sexually Abused: No   Social History   Tobacco Use  Smoking Status Former   Current packs/day: 0.00   Average packs/day: 1 pack/day for 15.0 years (15.0 ttl pk-yrs)   Types: Cigarettes   Quit date: 12/20/2015   Years since quitting: 7.8   Passive exposure: Past  Smokeless Tobacco Never   Social History   Substance and Sexual Activity  Alcohol Use No    Family History:  Family History  Problem Relation Age of Onset   Arthritis Mother    Cancer Mother        breast   Cancer Father        lymphoma   Diabetes Father    Hearing loss Father    Heart disease Father    Hypertension Father    Kidney disease Father    Stroke Maternal Grandmother    Arthritis Maternal  Grandmother    Birth defects Maternal Grandfather    Arthritis Paternal Grandmother    Heart disease Paternal Gerda Knows' disease Daughter    Fibromyalgia Sister    Heart disease Brother    Cancer Brother    Heart disease Brother     Past medical history, surgical history, medications, allergies, family history and social history reviewed with patient today and changes made to appropriate areas  of the chart.   Review of Systems  Constitutional: Negative.   HENT: Negative.    Eyes: Negative.   Respiratory: Negative.    Cardiovascular:  Positive for leg swelling (slight). Negative for chest pain, palpitations, orthopnea, claudication and PND.  Gastrointestinal: Negative.   Genitourinary: Negative.   Musculoskeletal: Negative.   Skin: Negative.   Neurological:  Positive for tingling. Negative for dizziness, tremors, sensory change, speech change, focal weakness, seizures, loss of consciousness, weakness and headaches.  Endo/Heme/Allergies: Negative.   Psychiatric/Behavioral: Negative.     All other ROS negative except what is listed above and in the HPI.      Objective:     BP 136/78 (BP Location: Left Arm, Patient Position: Sitting, Cuff Size: Large)   Pulse 87   Ht 5\' 8"  (1.727 m)   Wt 231 lb 3.2 oz (104.9 kg)   SpO2 94%   BMI 35.15 kg/m   Wt Readings from Last 3 Encounters:  10/18/23 231 lb 3.2 oz (104.9 kg)  04/20/23 233 lb (105.7 kg)  04/19/23 235 lb (106.6 kg)    Physical Exam Vitals and nursing note reviewed.  Constitutional:      General: He is not in acute distress.    Appearance: Normal appearance. He is obese. He is not ill-appearing, toxic-appearing or diaphoretic.  HENT:     Head: Normocephalic and atraumatic.     Right Ear: Tympanic membrane, ear canal and external ear normal. There is no impacted cerumen.     Left Ear: Tympanic membrane, ear canal and external ear normal. There is no impacted cerumen.     Nose: Nose normal. No congestion or  rhinorrhea.     Mouth/Throat:     Mouth: Mucous membranes are moist.     Pharynx: Oropharynx is clear. No oropharyngeal exudate or posterior oropharyngeal erythema.  Eyes:     General: No scleral icterus.       Right eye: No discharge.        Left eye: No discharge.     Extraocular Movements: Extraocular movements intact.     Conjunctiva/sclera: Conjunctivae normal.     Pupils: Pupils are equal, round, and reactive to light.  Neck:     Vascular: No carotid bruit.  Cardiovascular:     Rate and Rhythm: Normal rate and regular rhythm.     Pulses: Normal pulses.     Heart sounds: No murmur heard.    No friction rub. No gallop.  Pulmonary:     Effort: Pulmonary effort is normal. No respiratory distress.     Breath sounds: Normal breath sounds. No stridor. No wheezing, rhonchi or rales.  Chest:     Chest wall: No tenderness.  Abdominal:     General: Abdomen is flat. Bowel sounds are normal. There is no distension.     Palpations: Abdomen is soft. There is no mass.     Tenderness: There is no abdominal tenderness. There is no right CVA tenderness, left CVA tenderness, guarding or rebound.     Hernia: No hernia is present.  Genitourinary:    Comments: Genital exam deferred with shared decision making Musculoskeletal:        General: No swelling, tenderness, deformity or signs of injury.     Cervical back: Normal range of motion and neck supple. No rigidity. No muscular tenderness.     Right lower leg: No edema.     Left lower leg: No edema.  Lymphadenopathy:     Cervical: No cervical adenopathy.  Skin:    General: Skin is warm and dry.     Capillary Refill: Capillary refill takes less than 2 seconds.     Coloration: Skin is not jaundiced or pale.     Findings: No bruising, erythema, lesion or rash.  Neurological:     General: No focal deficit present.     Mental Status: He is alert and oriented to person, place, and time.     Cranial Nerves: No cranial nerve deficit.      Sensory: No sensory deficit.     Motor: No weakness.     Coordination: Coordination normal.     Gait: Gait normal.     Deep Tendon Reflexes: Reflexes normal.  Psychiatric:        Mood and Affect: Mood normal.        Behavior: Behavior normal.        Thought Content: Thought content normal.        Judgment: Judgment normal.     Results for orders placed or performed in visit on 10/17/23  PSA   Collection Time: 10/17/23  2:49 PM  Result Value Ref Range   Prostate Specific Ag, Serum 5.9 (H) 0.0 - 4.0 ng/mL      Assessment & Plan:   Problem List Items Addressed This Visit       Cardiovascular and Mediastinum   HTN (hypertension)   Under good control on current regimen. Continue current regimen. Continue to monitor. Call with any concerns. Refills given. Labs drawn today.        Relevant Medications   lisinopril  (ZESTRIL ) 20 MG tablet   Other Relevant Orders   Microalbumin, Urine Waived     Nervous and Auditory   Neuropathy   Under good control on current regimen. Continue current regimen. Continue to monitor. Call with any concerns. Refills given.        Other Visit Diagnoses       Routine general medical examination at a health care facility    -  Primary   Vaccines up to date/declined. Screening labs checked today. Colonoscopy declined- will consider at the end of the year. Continue diet/exercise. Call w concerns   Relevant Orders   Comprehensive metabolic panel with GFR   CBC with Differential/Platelet   Lipid Panel w/o Chol/HDL Ratio   TSH     Elevated PSA       Labs drawn yesterday at urology.     History of bronchitis       Relevant Medications   albuterol  (VENTOLIN  HFA) 108 (90 Base) MCG/ACT inhaler        LABORATORY TESTING:  Health maintenance labs ordered today as discussed above.   IMMUNIZATIONS:   - Tdap: Tetanus vaccination status reviewed: last tetanus booster within 10 years. - Influenza: Postponed to flu season - Pneumovax: Not  applicable - Prevnar: Not applicable - COVID: Refused - HPV: Not applicable - Shingrix vaccine: Refused  SCREENING: - Colonoscopy: Refused  Discussed with patient purpose of the colonoscopy is to detect colon cancer at curable precancerous or early stages    PATIENT COUNSELING:    Sexuality: Discussed sexually transmitted diseases, partner selection, use of condoms, avoidance of unintended pregnancy  and contraceptive alternatives.   Advised to avoid cigarette smoking.  I discussed with the patient that most people either abstain from alcohol or drink within safe limits (<=14/week and <=4 drinks/occasion for males, <=7/weeks and <= 3 drinks/occasion for females) and that the risk for alcohol disorders and other health effects  rises proportionally with the number of drinks per week and how often a drinker exceeds daily limits.  Discussed cessation/primary prevention of drug use and availability of treatment for abuse.   Diet: Encouraged to adjust caloric intake to maintain  or achieve ideal body weight, to reduce intake of dietary saturated fat and total fat, to limit sodium intake by avoiding high sodium foods and not adding table salt, and to maintain adequate dietary potassium and calcium preferably from fresh fruits, vegetables, and low-fat dairy products.    stressed the importance of regular exercise  Injury prevention: Discussed safety belts, safety helmets, smoke detector, smoking near bedding or upholstery.   Dental health: Discussed importance of regular tooth brushing, flossing, and dental visits.   Follow up plan: NEXT PREVENTATIVE PHYSICAL DUE IN 1 YEAR. Return in about 6 months (around 04/19/2024).

## 2023-10-19 ENCOUNTER — Ambulatory Visit: Admitting: Urology

## 2023-10-19 ENCOUNTER — Ambulatory Visit: Payer: Self-pay | Admitting: Urology

## 2023-10-19 ENCOUNTER — Ambulatory Visit: Payer: Self-pay | Admitting: Family Medicine

## 2023-10-19 LAB — COMPREHENSIVE METABOLIC PANEL WITH GFR
ALT: 26 IU/L (ref 0–44)
AST: 24 IU/L (ref 0–40)
Albumin: 4.3 g/dL (ref 3.9–4.9)
Alkaline Phosphatase: 85 IU/L (ref 44–121)
BUN/Creatinine Ratio: 16 (ref 10–24)
BUN: 23 mg/dL (ref 8–27)
Bilirubin Total: 0.4 mg/dL (ref 0.0–1.2)
CO2: 21 mmol/L (ref 20–29)
Calcium: 9.9 mg/dL (ref 8.6–10.2)
Chloride: 102 mmol/L (ref 96–106)
Creatinine, Ser: 1.41 mg/dL — ABNORMAL HIGH (ref 0.76–1.27)
Globulin, Total: 2.5 g/dL (ref 1.5–4.5)
Glucose: 106 mg/dL — ABNORMAL HIGH (ref 70–99)
Potassium: 4.5 mmol/L (ref 3.5–5.2)
Sodium: 137 mmol/L (ref 134–144)
Total Protein: 6.8 g/dL (ref 6.0–8.5)
eGFR: 56 mL/min/{1.73_m2} — ABNORMAL LOW (ref >59)

## 2023-10-19 LAB — LIPID PANEL W/O CHOL/HDL RATIO
Cholesterol, Total: 211 mg/dL — ABNORMAL HIGH (ref 100–199)
HDL: 47 mg/dL (ref >39)
LDL Chol Calc (NIH): 122 mg/dL — ABNORMAL HIGH (ref 0–99)
Triglycerides: 241 mg/dL — ABNORMAL HIGH (ref 0–149)
VLDL Cholesterol Cal: 42 mg/dL — ABNORMAL HIGH (ref 5–40)

## 2023-10-19 LAB — MICROALBUMIN, URINE WAIVED
Creatinine, Urine Waived: 100 mg/dL (ref 10–300)
Microalb, Ur Waived: 10 mg/L (ref 0–19)
Microalb/Creat Ratio: <30 mg/g (ref <30)

## 2023-10-19 LAB — CBC WITH DIFFERENTIAL/PLATELET
Basophils Absolute: 0.1 10*3/uL (ref 0.0–0.2)
Basos: 1 %
EOS (ABSOLUTE): 0.3 10*3/uL (ref 0.0–0.4)
Eos: 3 %
Hematocrit: 47.6 % (ref 37.5–51.0)
Hemoglobin: 15.9 g/dL (ref 13.0–17.7)
Immature Grans (Abs): 0.1 10*3/uL (ref 0.0–0.1)
Immature Granulocytes: 1 %
Lymphocytes Absolute: 2.8 10*3/uL (ref 0.7–3.1)
Lymphs: 25 %
MCH: 30.8 pg (ref 26.6–33.0)
MCHC: 33.4 g/dL (ref 31.5–35.7)
MCV: 92 fL (ref 79–97)
Monocytes Absolute: 0.9 10*3/uL (ref 0.1–0.9)
Monocytes: 9 %
Neutrophils Absolute: 6.7 10*3/uL (ref 1.4–7.0)
Neutrophils: 61 %
Platelets: 278 10*3/uL (ref 150–450)
RBC: 5.17 x10E6/uL (ref 4.14–5.80)
RDW: 13.2 % (ref 11.6–15.4)
WBC: 10.8 10*3/uL (ref 3.4–10.8)

## 2023-10-19 LAB — TSH: TSH: 0.872 u[IU]/mL (ref 0.450–4.500)

## 2023-10-31 ENCOUNTER — Ambulatory Visit: Admitting: Urology

## 2023-10-31 ENCOUNTER — Encounter: Payer: Self-pay | Admitting: Urology

## 2023-10-31 VITALS — BP 130/74 | HR 83 | Ht 68.0 in | Wt 231.0 lb

## 2023-10-31 DIAGNOSIS — R972 Elevated prostate specific antigen [PSA]: Secondary | ICD-10-CM

## 2023-10-31 DIAGNOSIS — C61 Malignant neoplasm of prostate: Secondary | ICD-10-CM

## 2023-10-31 NOTE — Progress Notes (Signed)
 I, Maysun Jamey Mccallum, acting as a Neurosurgeon for Geraline Knapp, MD. ,have documented all relevant documentation on the behalf of Geraline Knapp, MD, as directed by Geraline Knapp, MD while in the presence of Geraline Knapp, MD.  Discussed the use of AI scribe software for clinical note transcription with the patient, who gave verbal consent to proceed.   10/31/2023 4:02 PM   Douglas Murillo [birth year] [id number]  Referring provider: Solomon Dupre, DO [address] [place],  [place] [address]  Chief Complaint  Patient presents with   Prostate Cancer   Urologic history 1. 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 Estanislao Heimlich, 10/232024, for a PSA of 4.7 and PI-RADS 5 lesion on prostate MRI. He underwent a standard 12-core template biopsy +3 ROI cores. No post-biopsy complaints. Pathology: ROI biopsy showed Gleason 3+3 adenocarcinoma involving 15% of the submitted tissue. The left mid-core showed Gleason 3+3 adenocarcinoma involving 49% of the submitted tissue; biopsy from the right lateral base showed atypical small acinar proliferation.  HPI: Douglas Murillo is a 63 y.o. male presents for a 6 month follow-up.   No complaints since last visit.  No bothersome lower urinary tract symptoms.  Denies dysuria, gross hematuria.  Denies flanck, abdominal or pelvic pain.  PSA 10/17/23 5.9  PSA trend   Prostate Specific Ag, Serum  Latest Ref Rng 0.0 - 4.0 ng/mL  02/14/2019 4.0   09/27/2020 3.9   10/03/2021 5.6 (H)   11/14/2021 4.4 (H)   10/09/2022 4.7 (H)   04/12/2023 6.4 (H)   10/17/2023 5.9 (H)      PMH: Past Medical History:  Diagnosis Date   Arthritis    In Hands   GERD (gastroesophageal reflux disease)    Hypertension    Neuromuscular disorder (HCC)    Neuropathy   Prostate cancer (HCC)     Surgical History: Past Surgical History:  Procedure Laterality Date   ANAL FISSURE REPAIR     CHOLECYSTECTOMY     HERNIA REPAIR     PROSTATE BIOPSY      Home  Medications:  Allergies as of 10/31/2023       Reactions   Prednisone Other (See Comments)   Joint Pain   Hctz [hydrochlorothiazide ] Other (See Comments)   Headache        Medication List        Accurate as of Oct 31, 2023  4:02 PM. If you have any questions, ask your nurse or doctor.          albuterol  108 (90 Base) MCG/ACT inhaler Commonly known as: VENTOLIN  HFA Inhale 1-2 puffs into the lungs every 6 (six) hours as needed for wheezing or shortness of breath.   esomeprazole  20 MG capsule Commonly known as: NEXIUM  Take 1 capsule (20 mg total) by mouth daily at 12 noon.   lisinopril  20 MG tablet Commonly known as: ZESTRIL  TAKE 1 TABLET BY MOUTH EVERY DAY   Melatonin 5 MG Caps Take by mouth.   MULTIVITAMIN PO Take by mouth daily.   nortriptyline  50 MG capsule Commonly known as: PAMELOR  Take 1 capsule (50 mg total) by mouth at bedtime.        Allergies:  Allergies  Allergen Reactions   Prednisone Other (See Comments)    Joint Pain   Hctz [Hydrochlorothiazide ] Other (See Comments)    Headache    Family History: Family History  Problem Relation Age of Onset   Arthritis Mother  Cancer Mother        breast   Cancer Father        lymphoma   Diabetes Father    Hearing loss Father    Heart disease Father    Hypertension Father    Kidney disease Father    Stroke Maternal Grandmother    Arthritis Maternal Grandmother    Birth defects Maternal Grandfather    Arthritis Paternal Grandmother    Heart disease Paternal Gerda Knows' disease Daughter    Fibromyalgia Sister    Heart disease Brother    Cancer Brother    Heart disease Brother     Social History:  reports that he quit smoking about 7 years ago. His smoking use included cigarettes. He has a 15 pack-year smoking history. He has been exposed to tobacco smoke. He has never used smokeless tobacco. He reports current drug use. Frequency: 2.00 times per week. Drug: Marijuana. He reports  that he does not drink alcohol.   Physical Exam: BP 130/74   Pulse 83   Ht 5\' 8"  (1.727 m)   Wt 231 lb (104.8 kg)   BMI 35.12 kg/m   Constitutional:  Alert and oriented, No acute distress. HEENT: [place] AT Respiratory: Normal respiratory effort, no increased work of breathing. Psychiatric: Normal mood and affect.   Assessment & Plan:    1. T1c low-risk prostate cancer PSA slightly up but was in a similar range in 2022.  Continue surveillance Follow up 6 months for PSA/DRE.  I have reviewed the above documentation for accuracy and completeness, and I agree with the above.   Geraline Knapp, MD  [facility] [address], [address] [place], [place] [address] [phone]

## 2023-11-05 ENCOUNTER — Other Ambulatory Visit: Payer: 59

## 2024-04-21 ENCOUNTER — Ambulatory Visit: Admitting: Family Medicine

## 2024-04-22 ENCOUNTER — Other Ambulatory Visit: Payer: Self-pay | Admitting: Family Medicine

## 2024-04-22 DIAGNOSIS — Z8709 Personal history of other diseases of the respiratory system: Secondary | ICD-10-CM

## 2024-04-22 NOTE — Telephone Encounter (Unsigned)
 Copied from CRM #[id number]. Topic: Clinical - Medication Refill >> Apr 22, 2024  8:18 AM Cynthia K wrote: Medication:  albuterol  (VENTOLIN  HFA) 108 (90 Base) MCG/ACT inhaler  esomeprazole  (NEXIUM ) 20 MG capsule lisinopril  (ZESTRIL ) 20 MG tablet nortriptyline  (PAMELOR ) 50 MG capsule  Has the patient contacted their pharmacy? Yes (Agent: If no, request that the patient contact the pharmacy for the refill. If patient does not wish to contact the pharmacy document the reason why and proceed with request.) (Agent: If yes, when and what did the pharmacy advise?) Pharmacy needs order to refill  This is the patient's preferred pharmacy:  [id number] - [place], [place] - [address] [place] [address] Phone: [phone] Fax: [phone]  Is this the correct pharmacy for this prescription? Yes If no, delete pharmacy and type the correct one.   Has the prescription been filled recently? No  Is the patient out of the medication? No  Has the patient been seen for an appointment in the last year OR does the patient have an upcoming appointment? Yes  Can we respond through MyChart? Yes  Agent: Please be advised that Rx refills may take up to 3 business days. We ask that you follow-up with your pharmacy.

## 2024-04-24 MED ORDER — NORTRIPTYLINE HCL 50 MG PO CAPS: 50.0000 mg | ORAL_CAPSULE | Freq: Every day | ORAL | 0 refills | Status: DC

## 2024-04-24 MED ORDER — ESOMEPRAZOLE MAGNESIUM 20 MG PO CPDR: 20.0000 mg | DELAYED_RELEASE_CAPSULE | Freq: Every day | ORAL | 0 refills | Status: DC

## 2024-04-24 MED ORDER — ALBUTEROL SULFATE HFA 108 (90 BASE) MCG/ACT IN AERS: 1.0000 | INHALATION_SPRAY | Freq: Four times a day (QID) | RESPIRATORY_TRACT | 0 refills | Status: DC | PRN

## 2024-04-24 MED ORDER — LISINOPRIL 20 MG PO TABS: ORAL_TABLET | ORAL | 0 refills | Status: DC

## 2024-04-24 NOTE — Telephone Encounter (Signed)
 Requested Prescriptions  Pending Prescriptions Disp Refills   albuterol  (VENTOLIN  HFA) 108 (90 Base) MCG/ACT inhaler 18 each 0    Sig: Inhale 1-2 puffs into the lungs every 6 (six) hours as needed for wheezing or shortness of breath.     Pulmonology:  Beta Agonists 2 Passed - 04/24/2024  3:38 PM      Passed - Last BP in normal range    BP Readings from Last 1 Encounters:  10/31/23 130/74         Passed - Last Heart Rate in normal range    Pulse Readings from Last 1 Encounters:  10/31/23 83         Passed - Valid encounter within last 12 months    Recent Outpatient Visits           6 months ago Routine general medical examination at a health care facility   [facility] [place], [place] P, DO       Future Appointments             In 2 weeks Stoioff, Glendia BROCKS, MD [facility] [place]             esomeprazole  (NEXIUM ) 20 MG capsule 90 capsule 0    Sig: Take 1 capsule (20 mg total) by mouth daily at 12 noon.     Gastroenterology: Proton Pump Inhibitors 2 Passed - 04/24/2024  3:38 PM      Passed - ALT in normal range and within 360 days    ALT  Date Value Ref Range Status  10/18/2023 26 0 - 44 IU/L Final         Passed - AST in normal range and within 360 days    AST  Date Value Ref Range Status  10/18/2023 24 0 - 40 IU/L Final         Passed - Valid encounter within last 12 months    Recent Outpatient Visits           6 months ago Routine general medical examination at a health care facility   [facility] [place], [place] P, DO       Future Appointments             In 2 weeks Stoioff, Glendia BROCKS, MD [facility] [place]             lisinopril  (ZESTRIL ) 20 MG tablet 90 tablet 0    Sig: TAKE 1 TABLET BY MOUTH EVERY DAY     Cardiovascular:  ACE Inhibitors Failed - 04/24/2024  3:38 PM      Failed - Cr in normal range and within 180 days    Creatinine, Ser  Date Value Ref Range Status   10/18/2023 1.41 (H) 0.76 - 1.27 mg/dL Final         Failed - K in normal range and within 180 days    Potassium  Date Value Ref Range Status  10/18/2023 4.5 3.5 - 5.2 mmol/L Final         Failed - Valid encounter within last 6 months    Recent Outpatient Visits           6 months ago Routine general medical examination at a health care facility   [facility] Duwaine SQUIBB, DO       Future Appointments             In 2 weeks Stoioff, Glendia BROCKS, MD  [facility] - Patient is not pregnant      Passed - Last BP in normal range    BP Readings from Last 1 Encounters:  10/31/23 130/74          nortriptyline  (PAMELOR ) 50 MG capsule 90 capsule 0    Sig: Take 1 capsule (50 mg total) by mouth at bedtime.     Psychiatry:  Antidepressants - Heterocyclics (TCAs) Failed - 04/24/2024  3:38 PM      Failed - Valid encounter within last 6 months    Recent Outpatient Visits           6 months ago Routine general medical examination at a health care facility   [facility] [place], Duwaine SQUIBB, DO       Future Appointments             In 2 weeks Stoioff, Glendia BROCKS, MD [facility]

## 2024-04-30 ENCOUNTER — Other Ambulatory Visit

## 2024-05-06 ENCOUNTER — Ambulatory Visit: Payer: 59 | Admitting: Nurse Practitioner

## 2024-05-06 ENCOUNTER — Telehealth: Admitting: Physician Assistant

## 2024-05-06 ENCOUNTER — Other Ambulatory Visit: Payer: 59

## 2024-05-06 DIAGNOSIS — J208 Acute bronchitis due to other specified organisms: Secondary | ICD-10-CM

## 2024-05-06 MED ORDER — BENZONATATE 100 MG PO CAPS: 100.0000 mg | ORAL_CAPSULE | Freq: Three times a day (TID) | ORAL | 0 refills | Status: DC | PRN

## 2024-05-06 NOTE — Progress Notes (Signed)
 We are sorry that you are not feeling well.  Here is how we plan to help!  Based on your presentation I believe you most likely have A cough due to a virus.  This is called viral bronchitis and is best treated by rest, plenty of fluids and control of the cough.  You may use Ibuprofen or Tylenol as directed to help your symptoms.     In addition you may use A prescription cough medication called Tessalon  Perles 100mg . You may take 1-2 capsules every 8 hours as needed for your cough.  I see you were given a refill of your Albuterol  recently. Are you using as directed?   From your responses in the eVisit questionnaire you describe inflammation in the upper respiratory tract which is causing a significant cough.  This is commonly called Bronchitis and has four common causes:   Allergies Viral Infections Acid Reflux Bacterial Infection Allergies, viruses and acid reflux are treated by controlling symptoms or eliminating the cause. An example might be a cough caused by taking certain blood pressure medications. You stop the cough by changing the medication. Another example might be a cough caused by acid reflux. Controlling the reflux helps control the cough.  USE OF BRONCHODILATOR (RESCUE) INHALERS: There is a risk from using your bronchodilator too frequently.  The risk is that over-reliance on a medication which only relaxes the muscles surrounding the breathing tubes can reduce the effectiveness of medications prescribed to reduce swelling and congestion of the tubes themselves.  Although you feel brief relief from the bronchodilator inhaler, your asthma may actually be worsening with the tubes becoming more swollen and filled with mucus.  This can delay other crucial treatments, such as oral steroid medications. If you need to use a bronchodilator inhaler daily, several times per day, you should discuss this with your provider.  There are probably better treatments that could be used to keep your  asthma under control.     HOME CARE Only take medications as instructed by your medical team. Complete the entire course of an antibiotic. Drink plenty of fluids and get plenty of rest. Avoid close contacts especially the very young and the elderly Cover your mouth if you cough or cough into your sleeve. Always remember to wash your hands A steam or ultrasonic humidifier can help congestion.   GET HELP RIGHT AWAY IF: You develop worsening fever. You become short of breath You cough up blood. Your symptoms persist after you have completed your treatment plan MAKE SURE YOU  Understand these instructions. Will watch your condition. Will get help right away if you are not doing well or get worse.  Your e-visit answers were reviewed by a board certified advanced clinical practitioner to complete your personal care plan.  Depending on the condition, your plan could have included both over the counter or prescription medications. If there is a problem please reply  once you have received a response from your provider. Your safety is important to us .  If you have drug allergies check your prescription carefully.    You can use MyChart to ask questions about today's visit, request a non-urgent call back, or ask for a work or school excuse for 24 hours related to this e-Visit. If it has been greater than 24 hours you will need to follow up with your provider, or enter a new e-Visit to address those concerns. You will get an e-mail in the next two days asking about your experience.  I hope  that your e-visit has been valuable and will speed your recovery. Thank you for using e-visits.   I have spent 5 minutes in review of e-visit questionnaire, review and updating patient chart, medical decision making and response to patient.   Elsie Velma Lunger, PA-C

## 2024-05-07 ENCOUNTER — Ambulatory Visit: Admitting: Urology

## 2024-05-08 ENCOUNTER — Other Ambulatory Visit

## 2024-05-08 DIAGNOSIS — R972 Elevated prostate specific antigen [PSA]: Secondary | ICD-10-CM

## 2024-05-09 ENCOUNTER — Encounter: Payer: Self-pay | Admitting: Urology

## 2024-05-09 ENCOUNTER — Ambulatory Visit: Payer: Self-pay | Admitting: Urology

## 2024-05-09 LAB — PSA: Prostate Specific Ag, Serum: 8.7 ng/mL — ABNORMAL HIGH (ref 0.0–4.0)

## 2024-05-14 ENCOUNTER — Encounter: Payer: Self-pay | Admitting: Urology

## 2024-05-14 ENCOUNTER — Ambulatory Visit: Admitting: Urology

## 2024-05-14 VITALS — BP 171/102 | HR 106 | Ht 70.0 in | Wt 231.0 lb

## 2024-05-14 DIAGNOSIS — C61 Malignant neoplasm of prostate: Secondary | ICD-10-CM

## 2024-05-14 NOTE — Progress Notes (Signed)
 05/14/2024 2:37 PM   Douglas Murillo Jan 31, 1961 [id number]  Referring provider: Vicci Duwaine SQUIBB, DO [address] [place],  [place] [address]  Chief Complaint  Patient presents with   Follow-up   Urologic history 1. 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 Francisca, 10/232024, for a PSA of 4.7 and PI-RADS 5 lesion on prostate MRI. He underwent a standard 12-core template biopsy +3 ROI cores. No post-biopsy complaints. Pathology: ROI biopsy showed Gleason 3+3 adenocarcinoma involving 15% of the submitted tissue. The left mid-core showed Gleason 3+3 adenocarcinoma involving 49% of the submitted tissue; biopsy from the right lateral base showed atypical small acinar proliferation.   HPI: Douglas Murillo is a 63 y.o. male who presents for semiannual follow-up  Stable lower urinary tract symptoms since last visit No dysuria or gross hematuria No flank, abdominal or pelvic pain He states he had been fighting a sinus infection and bronchitis the past few weeks and thinks this was why his most recent PSA was elevated but baseline PSA 05/08/2024 was 8.7   PSA trend    Prostate Specific Ag, Serum  Latest Ref Rng 0.0 - 4.0 ng/mL  02/14/2019 4.0   09/27/2020 3.9   10/03/2021 5.6    11/14/2021 4.4   10/09/2022 4.7   04/12/2023 6.4   10/17/2023 5.9   05/08/2024 8.7    PMH: Past Medical History:  Diagnosis Date   Arthritis    In Hands   GERD (gastroesophageal reflux disease)    Hypertension    Neuromuscular disorder (HCC)    Neuropathy   Prostate cancer (HCC)     Surgical History: Past Surgical History:  Procedure Laterality Date   ANAL FISSURE REPAIR     CHOLECYSTECTOMY     HERNIA REPAIR     PROSTATE BIOPSY      Home Medications:  Allergies as of 05/14/2024       Reactions   Prednisone Other (See Comments)   Joint Pain   Hctz [hydrochlorothiazide ] Other (See Comments)   Headache        Medication List        Accurate as of May 14, 2024  2:37 PM. If you have any questions, ask your nurse or doctor.          albuterol  108 (90 Base) MCG/ACT inhaler Commonly known as: VENTOLIN  HFA Inhale 1-2 puffs into the lungs every 6 (six) hours as needed for wheezing or shortness of breath.   benzonatate  100 MG capsule Commonly known as: TESSALON  Take 1 capsule (100 mg total) by mouth 3 (three) times daily as needed for cough.   esomeprazole  20 MG capsule Commonly known as: NEXIUM  Take 1 capsule (20 mg total) by mouth daily at 12 noon.   lisinopril  20 MG tablet Commonly known as: ZESTRIL  TAKE 1 TABLET BY MOUTH EVERY DAY   Melatonin 5 MG Caps Take by mouth.   MULTIVITAMIN PO Take by mouth daily.   nortriptyline  50 MG capsule Commonly known as: PAMELOR  Take 1 capsule (50 mg total) by mouth at bedtime.        Allergies:  Allergies  Allergen Reactions   Prednisone Other (See Comments)    Joint Pain   Hctz [Hydrochlorothiazide ] Other (See Comments)    Headache    Family History: Family History  Problem Relation Age of Onset   Arthritis Mother    Cancer Mother        breast   Cancer Father  lymphoma   Diabetes Father    Hearing loss Father    Heart disease Father    Hypertension Father    Kidney disease Father    Stroke Maternal Grandmother    Arthritis Maternal Grandmother    Birth defects Maternal Grandfather    Arthritis Paternal Grandmother    Heart disease Paternal Apolinar Gavel' disease Daughter    Fibromyalgia Sister    Heart disease Brother    Cancer Brother    Heart disease Brother     Social History:  reports that he quit smoking about 8 years ago. His smoking use included cigarettes. He has a 15 pack-year smoking history. He has been exposed to tobacco smoke. He has never used smokeless tobacco. He reports current drug use. Frequency: 2.00 times per week. Drug: Marijuana. He reports that he does not drink alcohol.   Physical Exam: BP (!) 171/102   Pulse (!) 106   Ht  5' 10 (1.778 m)   Wt 231 lb (104.8 kg)   BMI 33.15 kg/m   Constitutional:  Alert, No acute distress. HEENT: [place] AT Respiratory: Normal respiratory effort, no increased work of breathing. GU: Prostate 25 g, smooth without nodules or induration Psychiatric: Normal mood and affect.   Assessment & Plan:    1.  T1c low risk prostate cancer PSA bump 8.7 Benign DRE Repeat PSA ~4 weeks to rule out transient elevation If PSA persistent elevated will schedule follow-up MRI   [name], MD  [facility] [address] [place], [place] [address] [phone]

## 2024-06-10 ENCOUNTER — Encounter: Payer: Self-pay | Admitting: Family Medicine

## 2024-06-10 ENCOUNTER — Ambulatory Visit: Admitting: Family Medicine

## 2024-06-10 VITALS — BP 120/84 | HR 90 | Temp 98.0°F | Ht 70.0 in | Wt 234.6 lb

## 2024-06-10 DIAGNOSIS — C61 Malignant neoplasm of prostate: Secondary | ICD-10-CM | POA: Diagnosis not present

## 2024-06-10 DIAGNOSIS — I10 Essential (primary) hypertension: Secondary | ICD-10-CM

## 2024-06-10 DIAGNOSIS — G629 Polyneuropathy, unspecified: Secondary | ICD-10-CM | POA: Diagnosis not present

## 2024-06-10 MED ORDER — NORTRIPTYLINE HCL 50 MG PO CAPS: 50.0000 mg | ORAL_CAPSULE | Freq: Every day | ORAL | 1 refills | Status: AC

## 2024-06-10 MED ORDER — ESOMEPRAZOLE MAGNESIUM 20 MG PO CPDR: 20.0000 mg | DELAYED_RELEASE_CAPSULE | Freq: Every day | ORAL | 1 refills | Status: AC

## 2024-06-10 MED ORDER — LISINOPRIL 20 MG PO TABS: ORAL_TABLET | ORAL | 1 refills | Status: AC

## 2024-06-10 MED ORDER — ALBUTEROL SULFATE HFA 108 (90 BASE) MCG/ACT IN AERS: 1.0000 | INHALATION_SPRAY | Freq: Four times a day (QID) | RESPIRATORY_TRACT | 6 refills | Status: AC | PRN

## 2024-06-10 NOTE — Assessment & Plan Note (Signed)
 Under good control on current regimen. Continue current regimen. Continue to monitor. Call with any concerns. Refills given.

## 2024-06-10 NOTE — Assessment & Plan Note (Signed)
Continue to follow with urology. Call with any concerns. Continue to monitor.  

## 2024-06-10 NOTE — Progress Notes (Signed)
 "  BP 120/84   Pulse 90   Temp 98 F (36.7 C) (Oral)   Ht 5' 10 (1.778 m)   Wt 234 lb 9.6 oz (106.4 kg)   SpO2 98%   BMI 33.66 kg/m    Subjective:    Patient ID: Douglas Murillo, male    DOB: Nov 25, 1960, 64 y.o.   MRN: [id number]  HPI: Douglas Murillo is a 64 y.o. male  Chief Complaint  Patient presents with   Hypertension   HYPERTENSION  Hypertension status: controlled  Satisfied with current treatment? yes Duration of hypertension: chronic BP monitoring frequency:  not checking BP medication side effects:  no Medication compliance: excellent compliance Previous BP meds:lisinopril  Aspirin: no Recurrent headaches: no Visual changes: no Palpitations: no Dyspnea: no Chest pain: no Lower extremity edema: no Dizzy/lightheaded: no  Neuropathy is doing well. Tolerating the medicine well. No concerns.   Relevant past medical, surgical, family and social history reviewed and updated as indicated. Interim medical history since our last visit reviewed. Allergies and medications reviewed and updated.  Review of Systems  Constitutional: Negative.   Respiratory: Negative.    Cardiovascular: Negative.   Gastrointestinal: Negative.   Musculoskeletal: Negative.   Neurological: Negative.   Psychiatric/Behavioral: Negative.      Per HPI unless specifically indicated above     Objective:    BP 120/84   Pulse 90   Temp 98 F (36.7 C) (Oral)   Ht 5' 10 (1.778 m)   Wt 234 lb 9.6 oz (106.4 kg)   SpO2 98%   BMI 33.66 kg/m   Wt Readings from Last 3 Encounters:  06/10/24 234 lb 9.6 oz (106.4 kg)  05/14/24 231 lb (104.8 kg)  10/31/23 231 lb (104.8 kg)    Physical Exam Vitals and nursing note reviewed.  Constitutional:      General: He is not in acute distress.    Appearance: Normal appearance. He is not ill-appearing, toxic-appearing or diaphoretic.  HENT:     Head: Normocephalic and atraumatic.     Right Ear: External ear normal.     Left Ear: External  ear normal.     Nose: Nose normal.     Mouth/Throat:     Mouth: Mucous membranes are moist.     Pharynx: Oropharynx is clear.  Eyes:     General: No scleral icterus.       Right eye: No discharge.        Left eye: No discharge.     Extraocular Movements: Extraocular movements intact.     Conjunctiva/sclera: Conjunctivae normal.     Pupils: Pupils are equal, round, and reactive to light.  Cardiovascular:     Rate and Rhythm: Normal rate and regular rhythm.     Pulses: Normal pulses.     Heart sounds: Normal heart sounds. No murmur heard.    No friction rub. No gallop.  Pulmonary:     Effort: Pulmonary effort is normal. No respiratory distress.     Breath sounds: Normal breath sounds. No stridor. No wheezing, rhonchi or rales.  Chest:     Chest wall: No tenderness.  Musculoskeletal:        General: Normal range of motion.     Cervical back: Normal range of motion and neck supple.  Skin:    General: Skin is warm and dry.     Capillary Refill: Capillary refill takes less than 2 seconds.     Coloration: Skin is not jaundiced or pale.  Findings: No bruising, erythema, lesion or rash.  Neurological:     General: No focal deficit present.     Mental Status: He is alert and oriented to person, place, and time. Mental status is at baseline.  Psychiatric:        Mood and Affect: Mood normal.        Behavior: Behavior normal.        Thought Content: Thought content normal.        Judgment: Judgment normal.     Results for orders placed or performed in visit on 05/08/24  PSA   Collection Time: 05/08/24  1:58 PM  Result Value Ref Range   Prostate Specific Ag, Serum 8.7 (H) 0.0 - 4.0 ng/mL      Assessment & Plan:   Problem List Items Addressed This Visit       Cardiovascular and Mediastinum   HTN (hypertension) - Primary   Under good control on current regimen. Continue current regimen. Continue to monitor. Call with any concerns. Refills given. Labs drawn today.         Relevant Medications   lisinopril  (ZESTRIL ) 20 MG tablet   Other Relevant Orders   Basic metabolic panel with GFR     Nervous and Auditory   Neuropathy   Under good control on current regimen. Continue current regimen. Continue to monitor. Call with any concerns. Refills given.          Genitourinary   Prostate cancer [facility])   Continue to follow with urology. Call with any concerns. Continue to monitor.         Follow up plan: Return in about 6 months (around 12/08/2024) for physical.      "

## 2024-06-10 NOTE — Assessment & Plan Note (Signed)
 Under good control on current regimen. Continue current regimen. Continue to monitor. Call with any concerns. Refills given. Labs drawn today.

## 2024-06-11 LAB — BASIC METABOLIC PANEL WITH GFR
BUN/Creatinine Ratio: 9 — ABNORMAL LOW (ref 10–24)
BUN: 13 mg/dL (ref 8–27)
CO2: 22 mmol/L (ref 20–29)
Calcium: 10.4 mg/dL — ABNORMAL HIGH (ref 8.6–10.2)
Chloride: 99 mmol/L (ref 96–106)
Creatinine, Ser: 1.45 mg/dL — ABNORMAL HIGH (ref 0.76–1.27)
Glucose: 83 mg/dL (ref 70–99)
Potassium: 4.5 mmol/L (ref 3.5–5.2)
Sodium: 137 mmol/L (ref 134–144)
eGFR: 54 mL/min/1.73 — ABNORMAL LOW

## 2024-06-16 ENCOUNTER — Ambulatory Visit: Payer: Self-pay | Admitting: Family Medicine

## 2024-06-17 ENCOUNTER — Other Ambulatory Visit

## 2024-06-17 DIAGNOSIS — R972 Elevated prostate specific antigen [PSA]: Secondary | ICD-10-CM

## 2024-06-18 LAB — PSA: Prostate Specific Ag, Serum: 5.8 ng/mL — ABNORMAL HIGH (ref 0.0–4.0)

## 2024-06-19 ENCOUNTER — Ambulatory Visit: Payer: Self-pay | Admitting: Urology

## 2024-11-12 ENCOUNTER — Other Ambulatory Visit

## 2024-11-14 ENCOUNTER — Ambulatory Visit: Admitting: Urology

## 2024-12-19 ENCOUNTER — Encounter: Admitting: Family Medicine
